# Patient Record
Sex: Female | Born: 1980 | Race: White | Hispanic: No | Marital: Married | Smoking: Never smoker
Health system: Southern US, Community
[De-identification: ages and names within clinical notes are randomized; demographics above are authoritative.]

## PROBLEM LIST (undated history)

## (undated) DIAGNOSIS — Z9889 Other specified postprocedural states: Secondary | ICD-10-CM

## (undated) DIAGNOSIS — M25579 Pain in unspecified ankle and joints of unspecified foot: Secondary | ICD-10-CM

## (undated) DIAGNOSIS — K219 Gastro-esophageal reflux disease without esophagitis: Secondary | ICD-10-CM

## (undated) DIAGNOSIS — T8859XA Other complications of anesthesia, initial encounter: Secondary | ICD-10-CM

## (undated) DIAGNOSIS — R51 Headache: Secondary | ICD-10-CM

## (undated) DIAGNOSIS — T4145XA Adverse effect of unspecified anesthetic, initial encounter: Secondary | ICD-10-CM

## (undated) DIAGNOSIS — R519 Headache, unspecified: Secondary | ICD-10-CM

## (undated) DIAGNOSIS — R112 Nausea with vomiting, unspecified: Secondary | ICD-10-CM

## (undated) HISTORY — PX: CARPAL TUNNEL RELEASE: SHX101

## (undated) HISTORY — PX: SPINAL CORD STIMULATOR IMPLANT: SHX2422

## (undated) HISTORY — PX: FOOT SURGERY: SHX648

## (undated) HISTORY — PX: ABDOMINAL HYSTERECTOMY: SHX81

## (undated) HISTORY — PX: WISDOM TOOTH EXTRACTION: SHX21

## (undated) HISTORY — PX: ENDOMETRIAL BIOPSY: SHX622

---

## 2005-01-24 ENCOUNTER — Emergency Department: Payer: Self-pay | Admitting: Emergency Medicine

## 2005-01-24 IMAGING — US US RENAL KIDNEY
1 series · 17 of 17 positions shown · non-contrast
Comparison: none

REASON FOR EXAM: Urinary retention
COMMENTS:

[Series 1: us renal kidney · 17 of 17 slices shown]
[im 1/17]
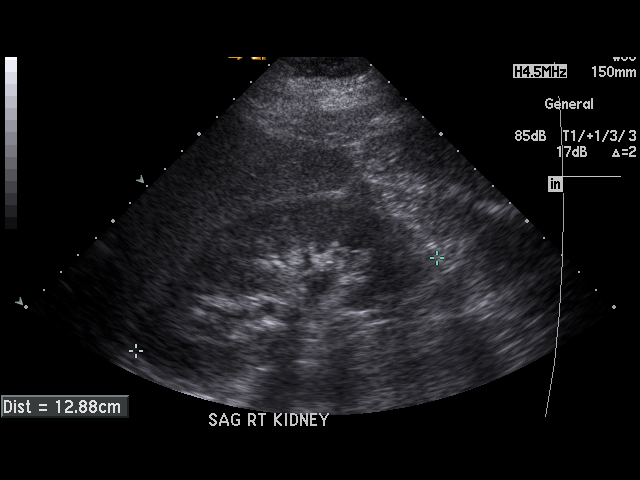
[im 2/17]
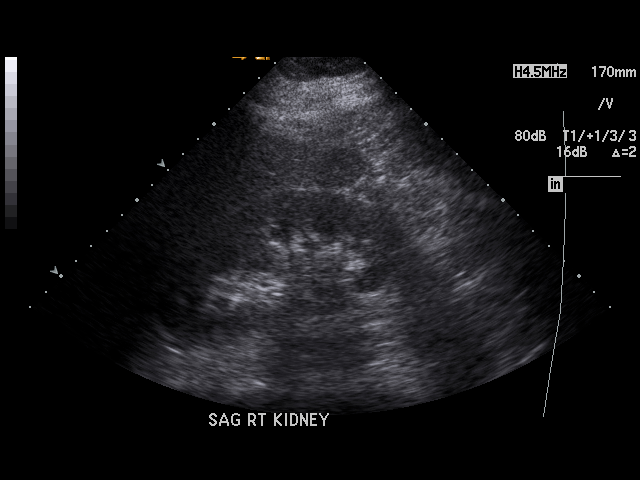
[im 3/17]
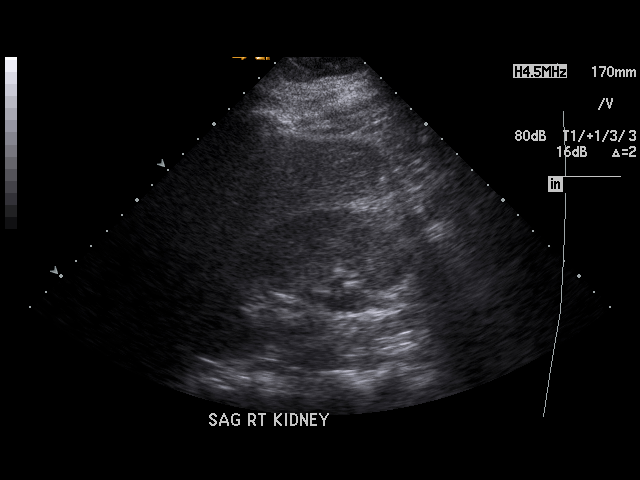
[im 4/17]
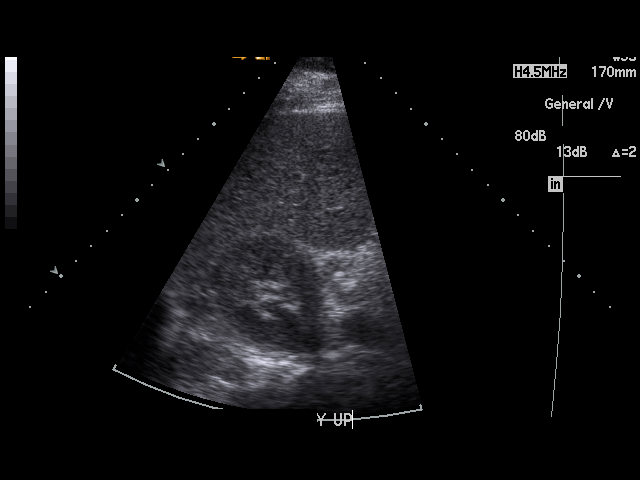
[im 5/17]
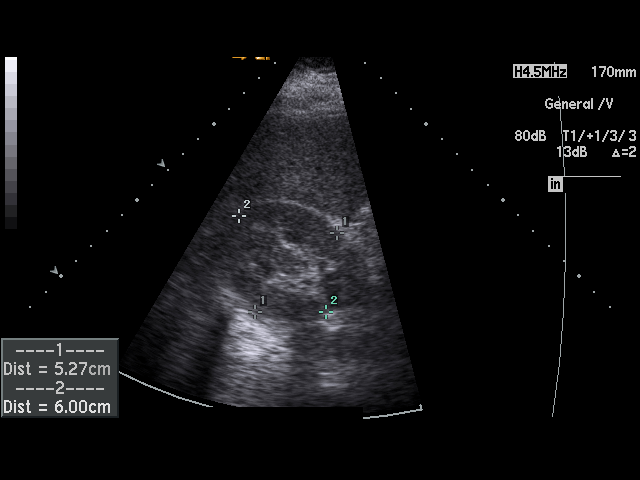
[im 6/17]
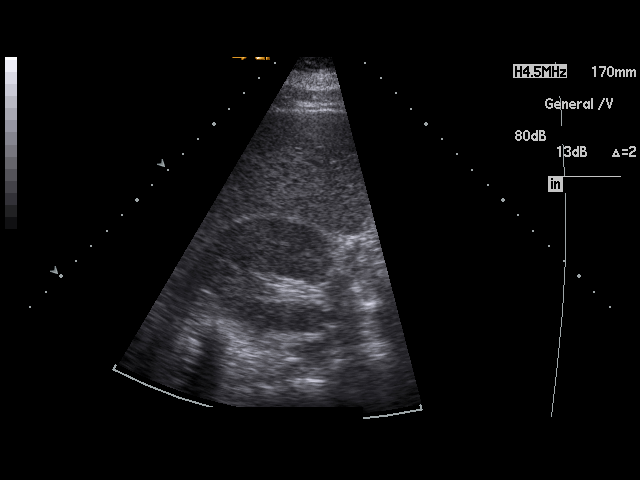
[im 7/17]
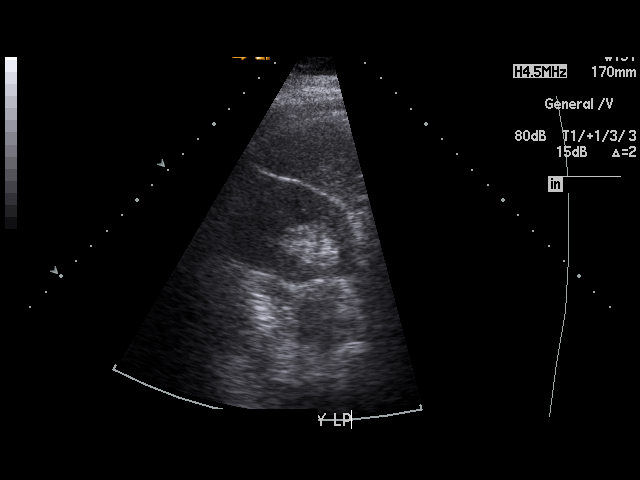
[im 8/17]
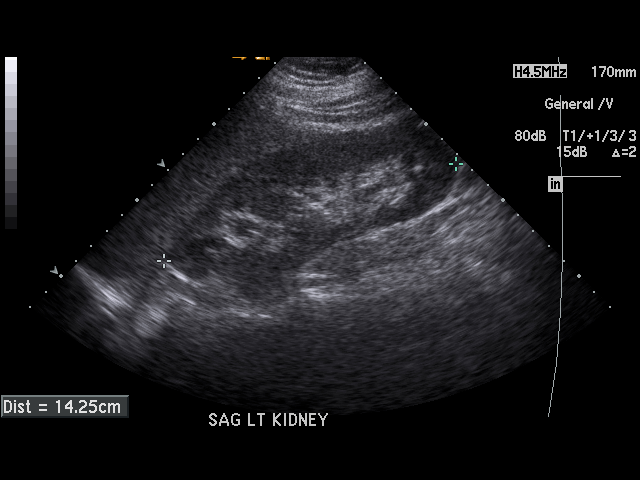
[im 9/17]
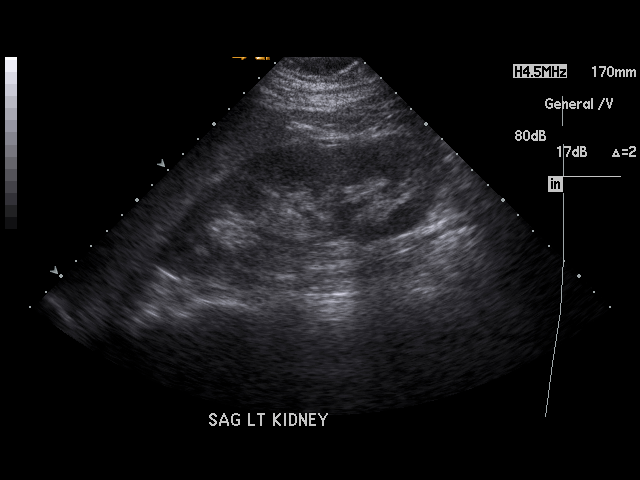
[im 10/17]
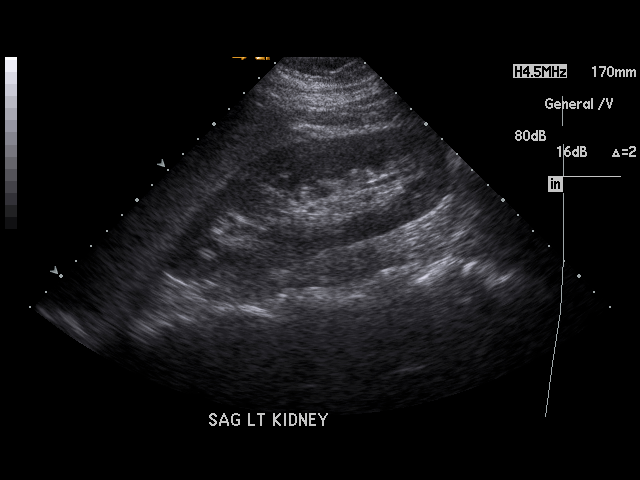
[im 11/17]
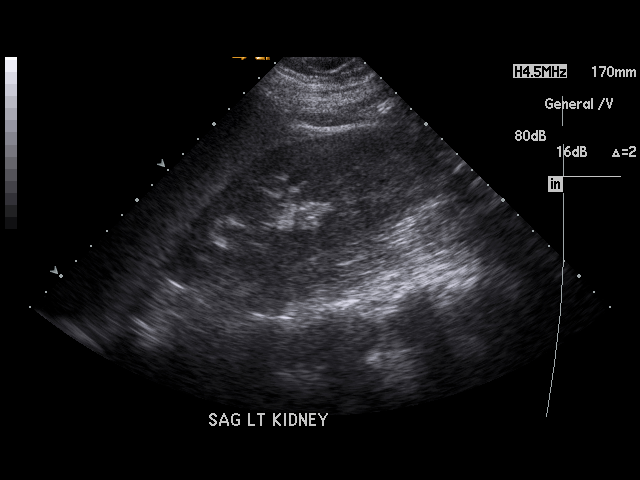
[im 12/17]
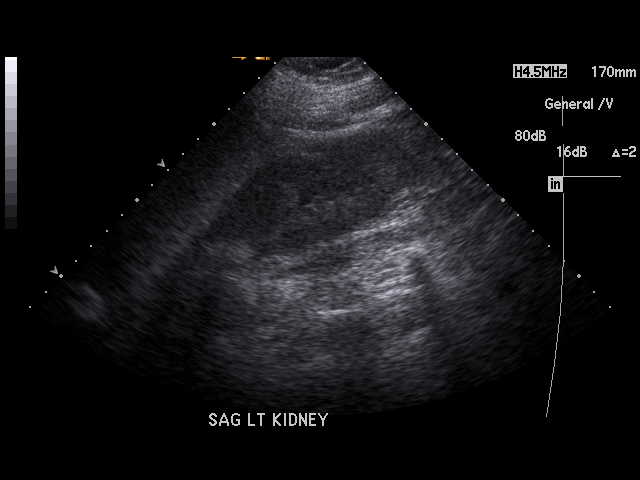
[im 13/17]
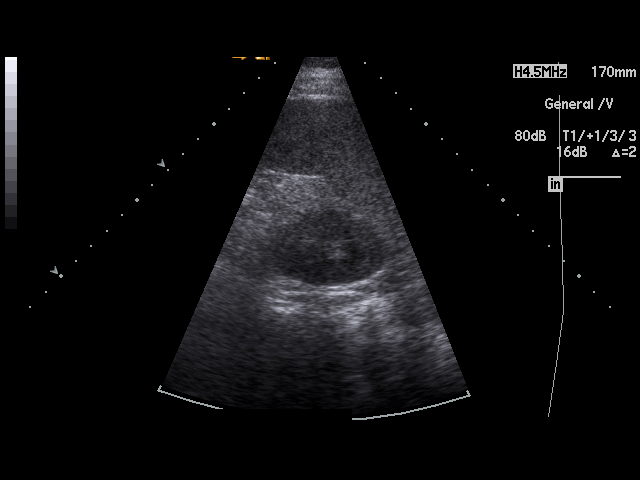
[im 14/17]
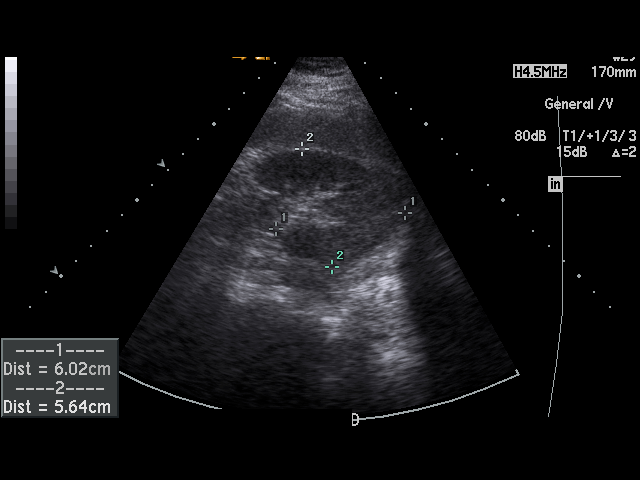
[im 15/17]
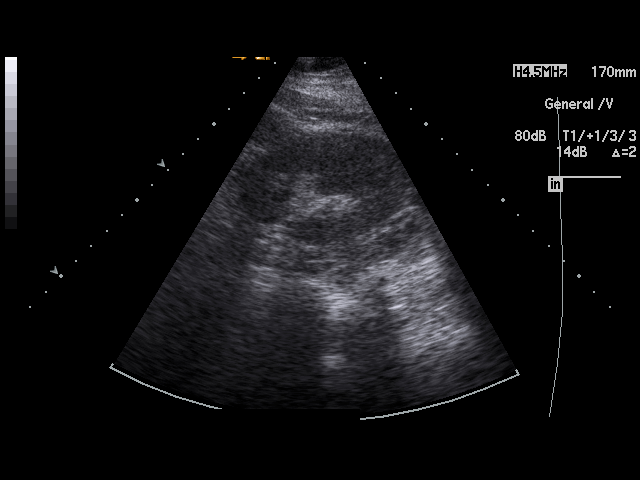
[im 16/17]
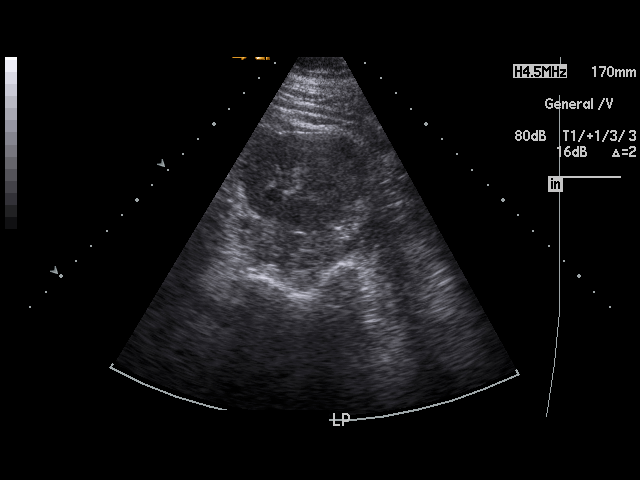
[im 17/17]
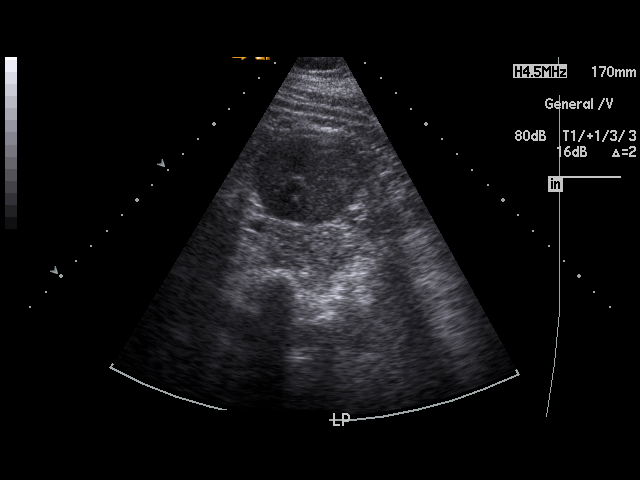

[17 of 17 positions shown; findings below may reference images not displayed]

PROCEDURE:     US  - US KIDNEY BILATERAL  - [DATE] [DATE]

RESULT:     The RIGHT kidney measures 12.88 cm x 5.27 cm x 6.00 cm and the
LEFT kidney measures 14.25 cm x 6.02 cm x 5.64 cm.  No solid or cystic renal
mass lesions are seen.  No renal calcifications are identified.  There is no
hydronephrosis.  The renal cortical margins are smooth.
IMPRESSION: No significant abnormalities are noted.

## 2005-08-02 ENCOUNTER — Observation Stay: Payer: Self-pay

## 2005-08-05 ENCOUNTER — Inpatient Hospital Stay: Payer: Self-pay | Admitting: Obstetrics & Gynecology

## 2006-04-03 ENCOUNTER — Ambulatory Visit: Payer: Self-pay | Admitting: Unknown Physician Specialty

## 2006-04-10 ENCOUNTER — Ambulatory Visit: Payer: Self-pay | Admitting: Unknown Physician Specialty

## 2008-06-28 ENCOUNTER — Ambulatory Visit: Payer: Self-pay | Admitting: Obstetrics and Gynecology

## 2012-07-17 ENCOUNTER — Ambulatory Visit: Payer: Self-pay | Admitting: Family Medicine

## 2012-07-17 LAB — COMPREHENSIVE METABOLIC PANEL
Anion Gap: 11 (ref 7–16)
BUN: 12 mg/dL (ref 7–18)
Bilirubin,Total: 0.9 mg/dL (ref 0.2–1.0)
Calcium, Total: 9.2 mg/dL (ref 8.5–10.1)
Chloride: 100 mmol/L (ref 98–107)
Co2: 28 mmol/L (ref 21–32)
Creatinine: 0.95 mg/dL (ref 0.60–1.30)
EGFR (African American): >60
Potassium: 3.9 mmol/L (ref 3.5–5.1)
SGOT(AST): 14 U/L — ABNORMAL LOW (ref 15–37)
Sodium: 139 mmol/L (ref 136–145)

## 2012-07-17 LAB — URINALYSIS, COMPLETE
Bilirubin,UR: NEGATIVE
Glucose,UR: NEGATIVE mg/dL (ref 0–75)
Ketone: NEGATIVE
Nitrite: NEGATIVE
Ph: 6.5 (ref 4.5–8.0)
Specific Gravity: 1.02 (ref 1.003–1.030)

## 2012-07-17 LAB — CBC WITH DIFFERENTIAL/PLATELET
Basophil #: 0 10*3/uL (ref 0.0–0.1)
Eosinophil #: 0 10*3/uL (ref 0.0–0.7)
Eosinophil %: 0.2 %
HGB: 14.5 g/dL (ref 12.0–16.0)
MCV: 86 fL (ref 80–100)
Monocyte %: 7.4 %
Neutrophil #: 6.1 10*3/uL (ref 1.4–6.5)
Neutrophil %: 82.6 %
Platelet: 206 10*3/uL (ref 150–440)
RBC: 4.9 10*6/uL (ref 3.80–5.20)
WBC: 7.4 10*3/uL (ref 3.6–11.0)

## 2012-07-19 LAB — URINE CULTURE

## 2013-05-05 DIAGNOSIS — G5600 Carpal tunnel syndrome, unspecified upper limb: Secondary | ICD-10-CM | POA: Insufficient documentation

## 2014-01-17 DIAGNOSIS — G43009 Migraine without aura, not intractable, without status migrainosus: Secondary | ICD-10-CM | POA: Insufficient documentation

## 2015-03-03 ENCOUNTER — Other Ambulatory Visit: Payer: Self-pay | Admitting: Podiatry

## 2015-03-03 DIAGNOSIS — M25571 Pain in right ankle and joints of right foot: Secondary | ICD-10-CM

## 2015-03-23 ENCOUNTER — Ambulatory Visit: Payer: Self-pay

## 2015-06-07 ENCOUNTER — Ambulatory Visit
Admission: RE | Admit: 2015-06-07 | Discharge: 2015-06-07 | Disposition: A | Discharge status: Home / Self Care | Payer: BLUE CROSS/BLUE SHIELD | Source: Ambulatory Visit | Attending: Otolaryngology | Admitting: Otolaryngology

## 2015-06-07 ENCOUNTER — Other Ambulatory Visit: Payer: Self-pay | Admitting: Otolaryngology

## 2015-06-07 DIAGNOSIS — G44001 Cluster headache syndrome, unspecified, intractable: Secondary | ICD-10-CM

## 2015-06-07 DIAGNOSIS — R51 Headache: Secondary | ICD-10-CM | POA: Insufficient documentation

## 2015-06-07 IMAGING — CR DG SINUSES COMPLETE 3+V
4 series · 5 of 5 positions shown · non-contrast
Comparison: None.

CLINICAL DATA: Cluster headache for several months, right side pain

EXAM:
PARANASAL SINUSES - COMPLETE 3 + VIEW

[[person_name]]
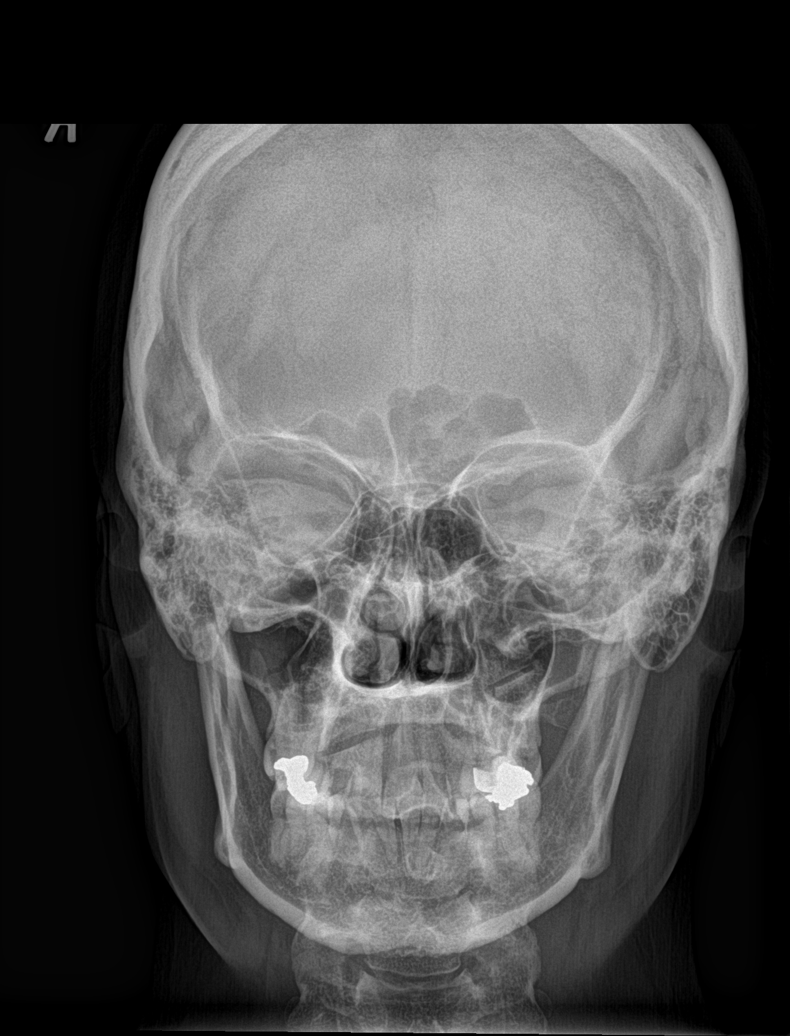

[pns waters]
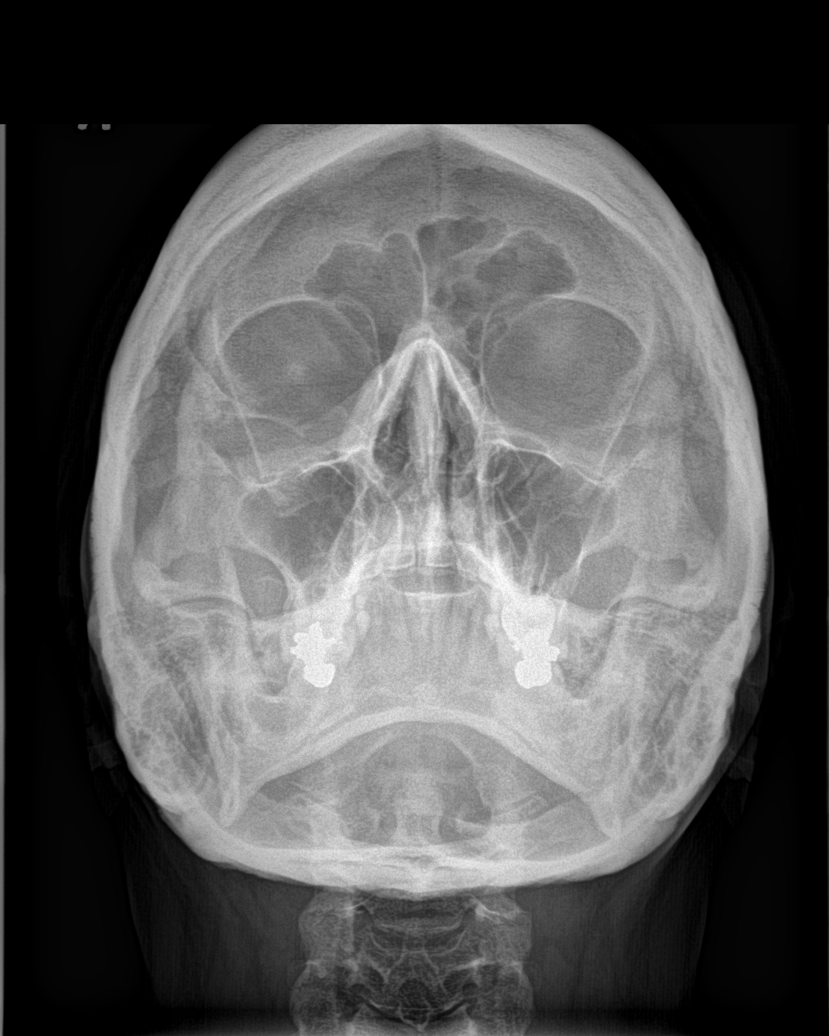

[pns lat]
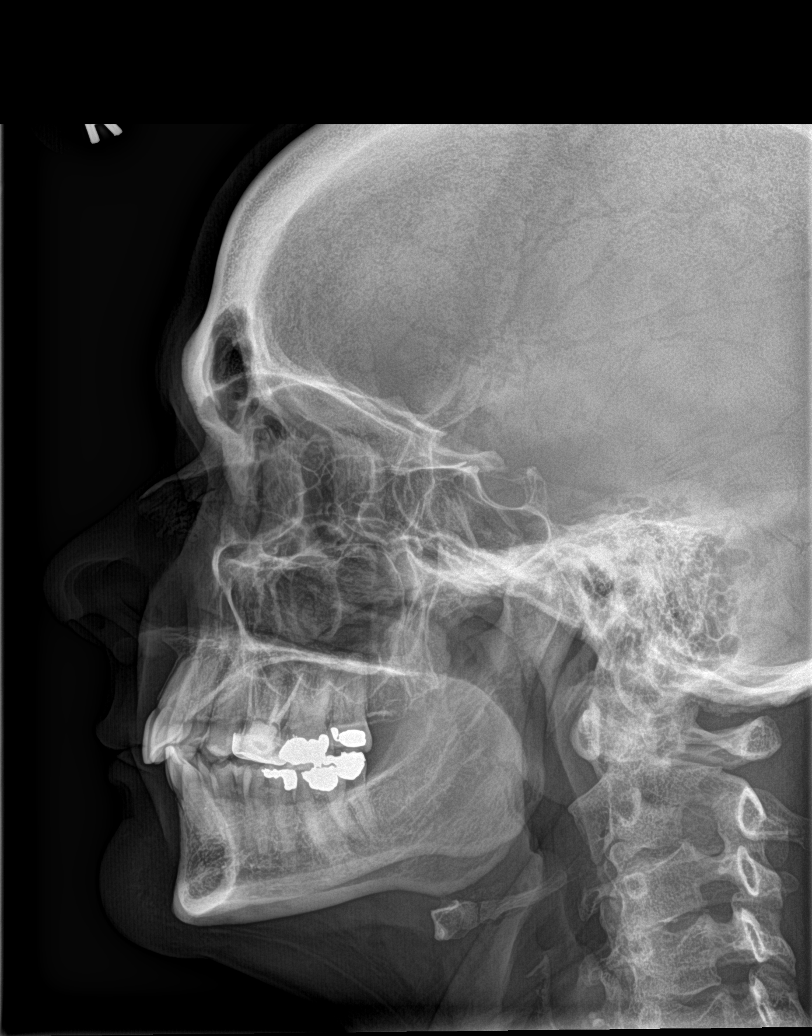

[Series 4: facial smv · 0.14mm/px · 2 of 2 slices shown]
[im 1/2]
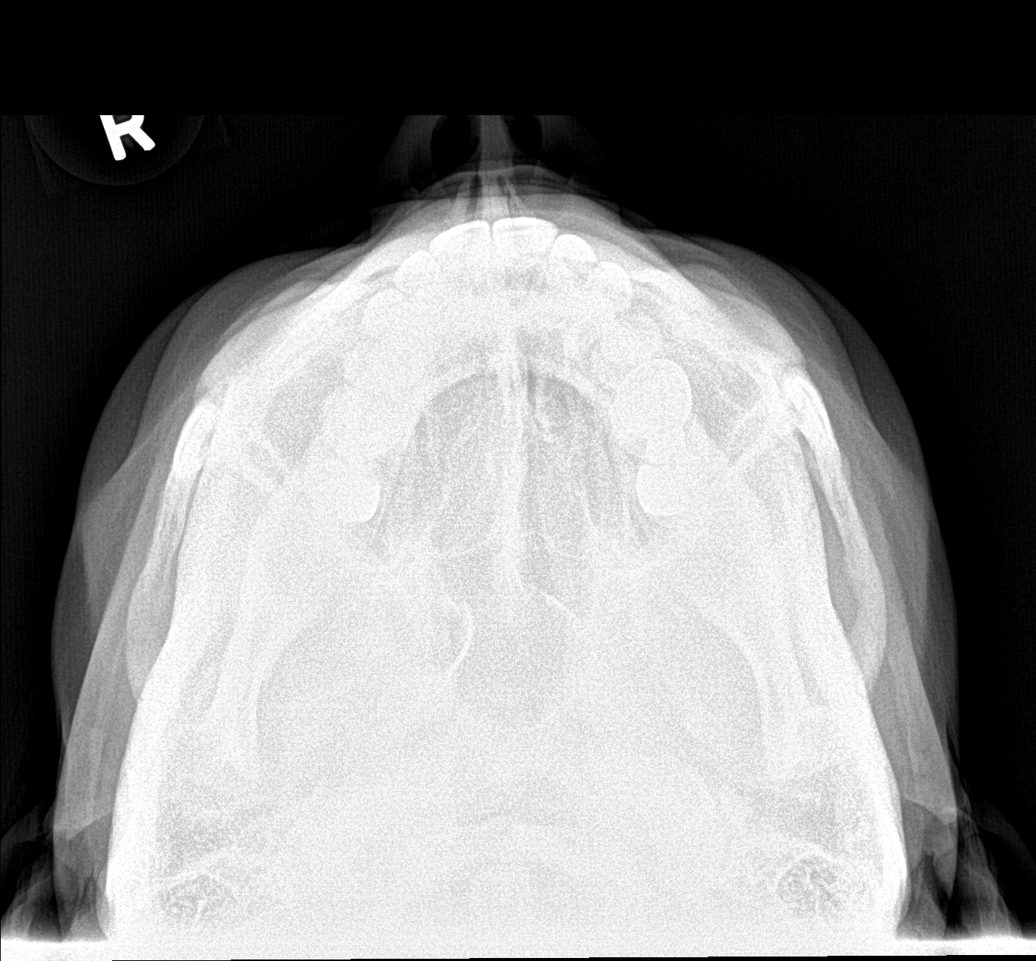
[im 2/2]
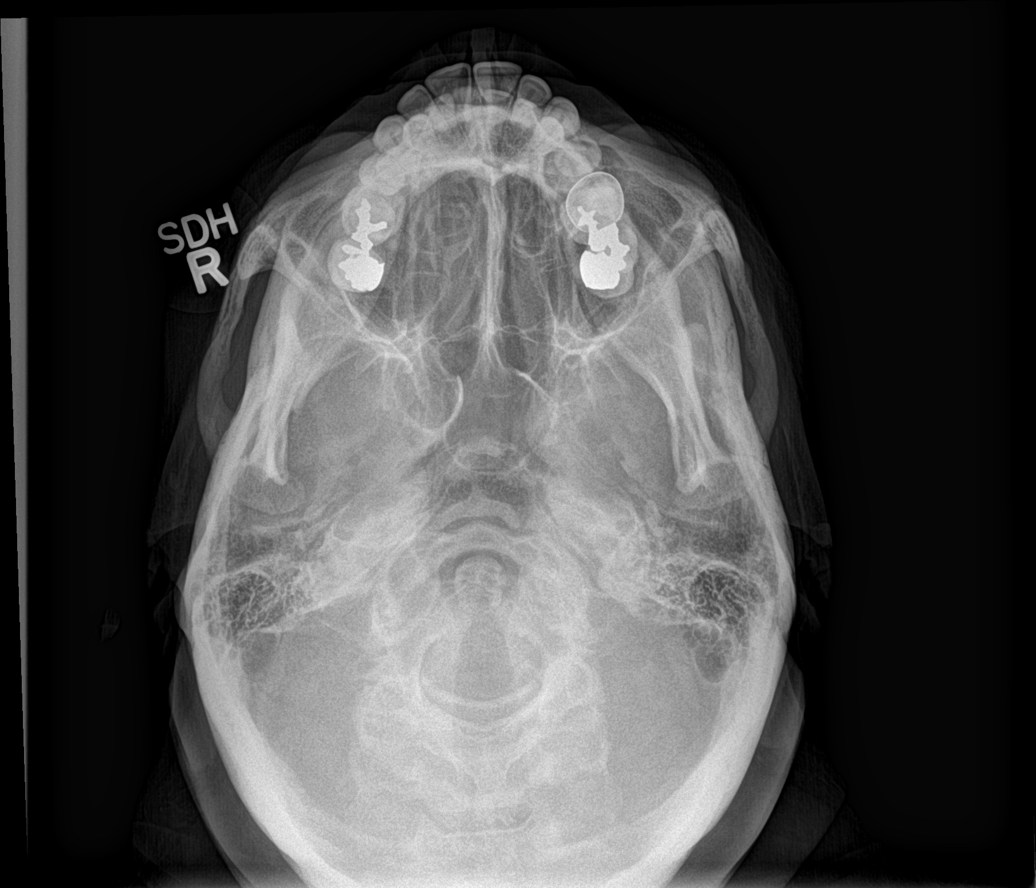

[5 of 5 positions shown; findings below may reference images not displayed]

FINDINGS: The paranasal sinus are aerated. There is no evidence of sinus
opacification air-fluid levels or mucosal thickening. No significant
bone abnormalities are seen.
IMPRESSION: Negative.

## 2015-09-30 IMAGING — US US ABDOMEN COMPLETE
1 series · 13 of 25 positions shown · non-contrast
Comparison: None.

CLINICAL DATA: Abdominal pain

EXAM:
ABDOMEN ULTRASOUND COMPLETE

[Series 1: us abdomen complete · 0.18mm/px · 13 of 90 slices shown]
[im 1/90]
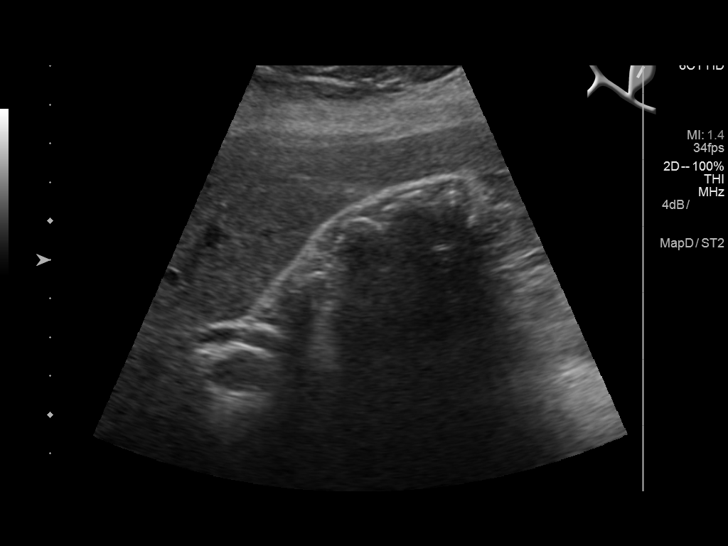
[im 8/90]
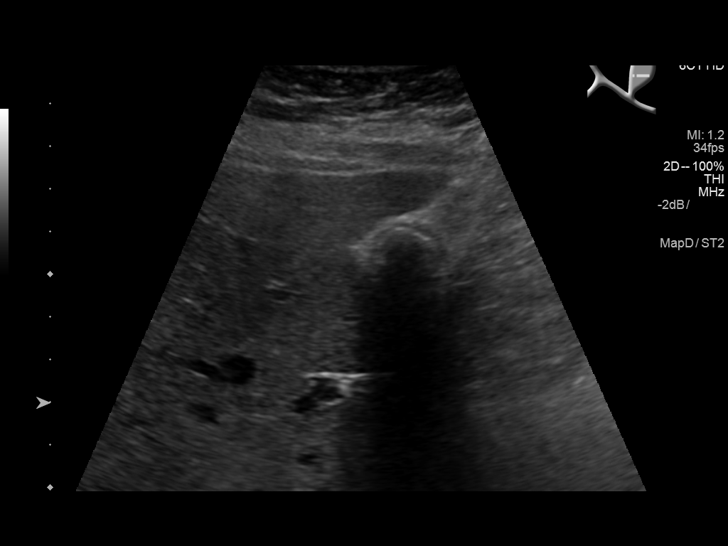
[im 15/90]
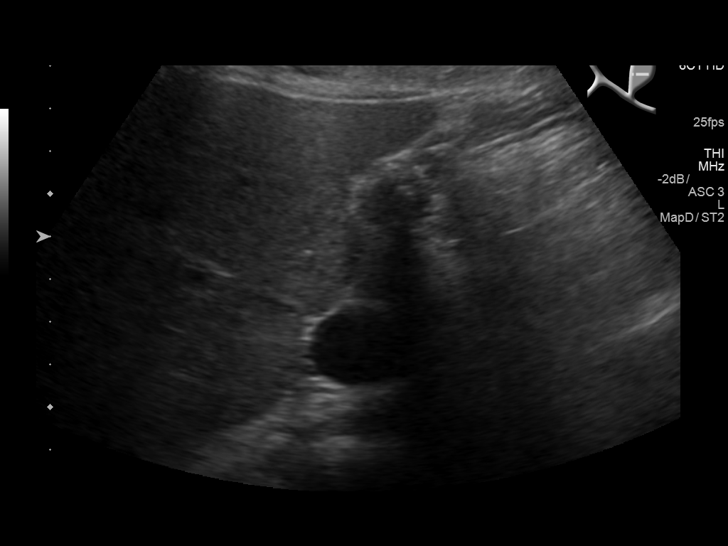
[im 23/90]
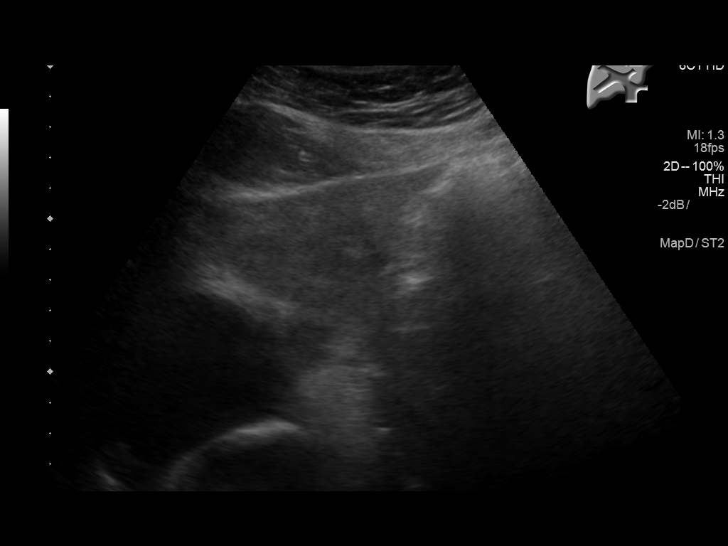
[im 30/90]
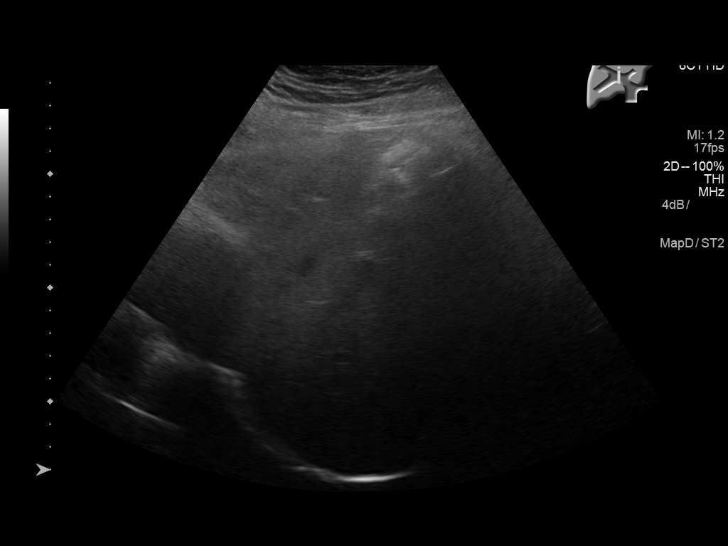
[im 38/90]
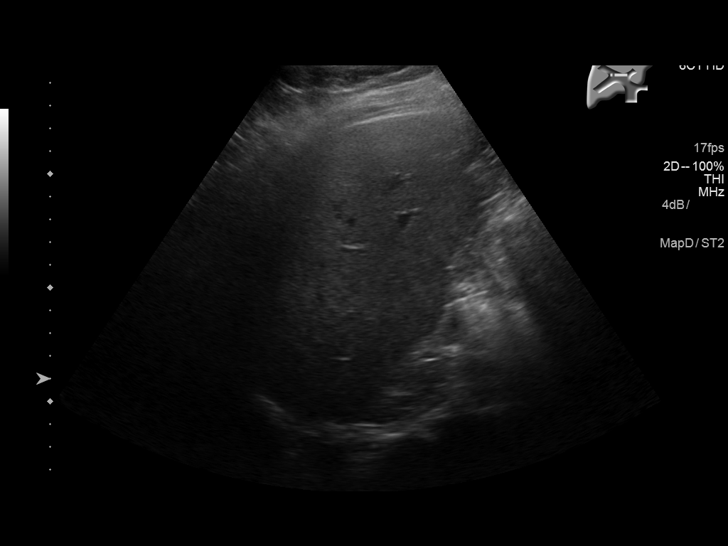
[im 45/90]
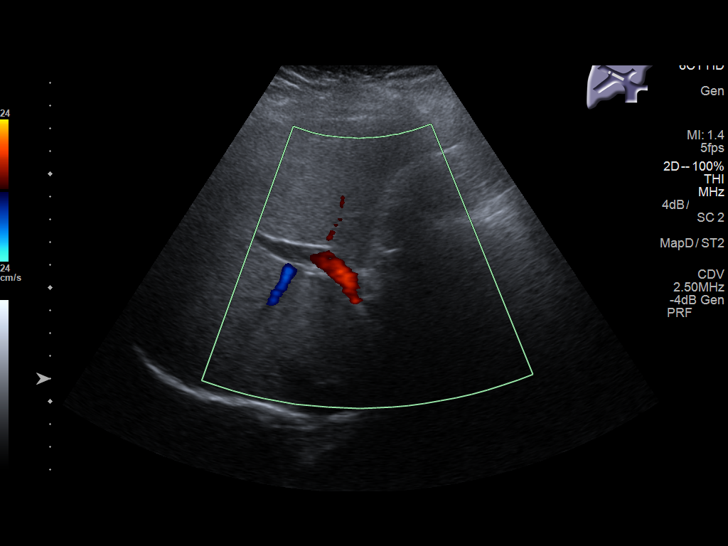
[im 52/90]
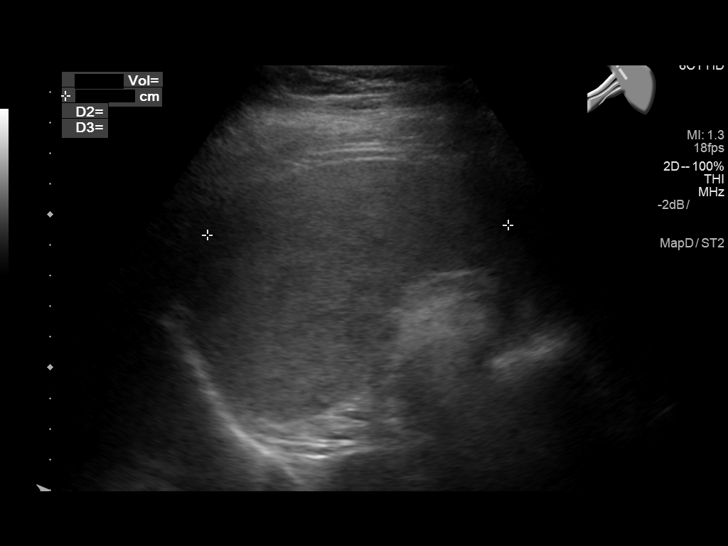
[im 60/90]
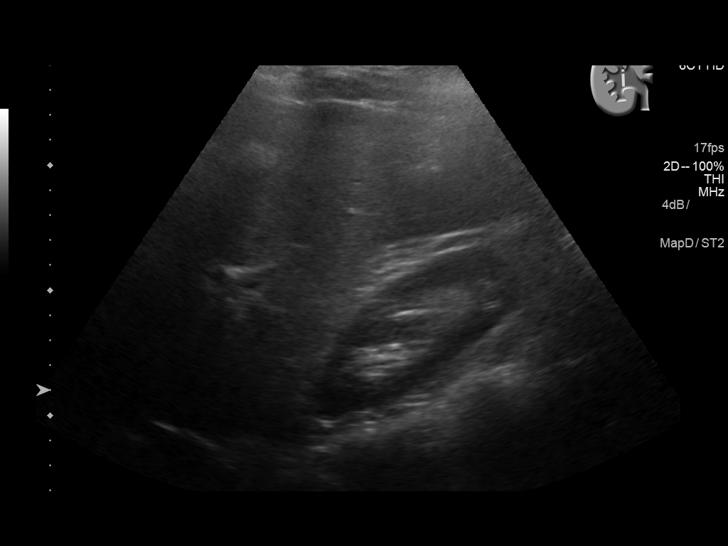
[im 67/90]
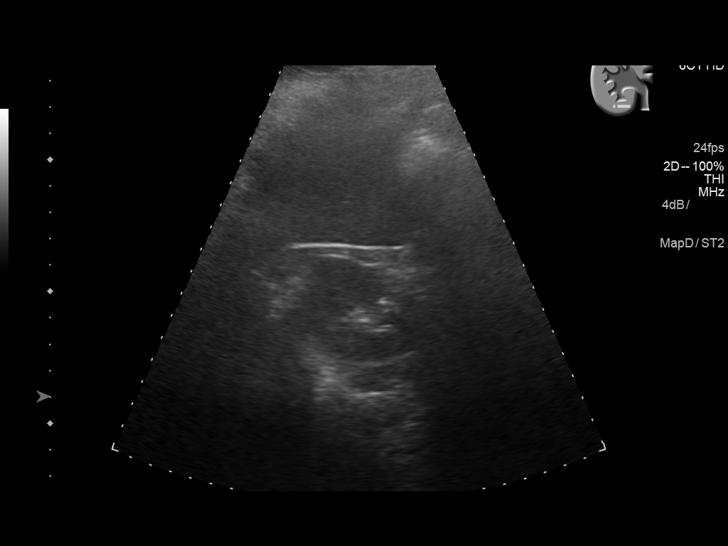
[im 75/90]
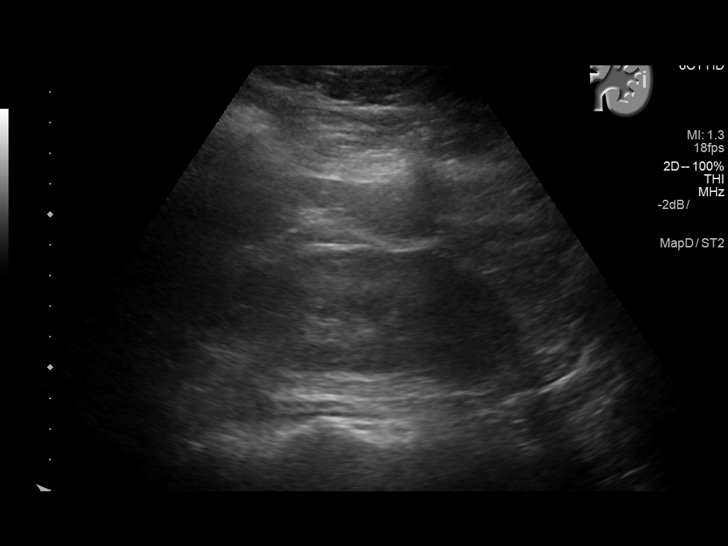
[im 82/90]
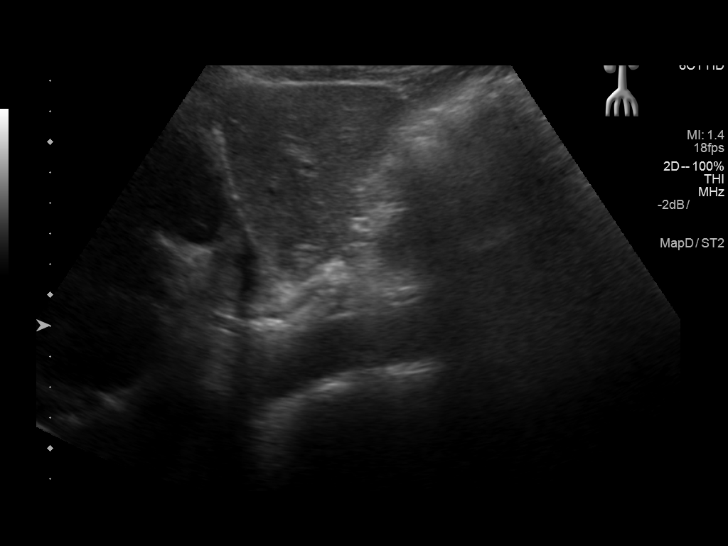
[im 90/90]
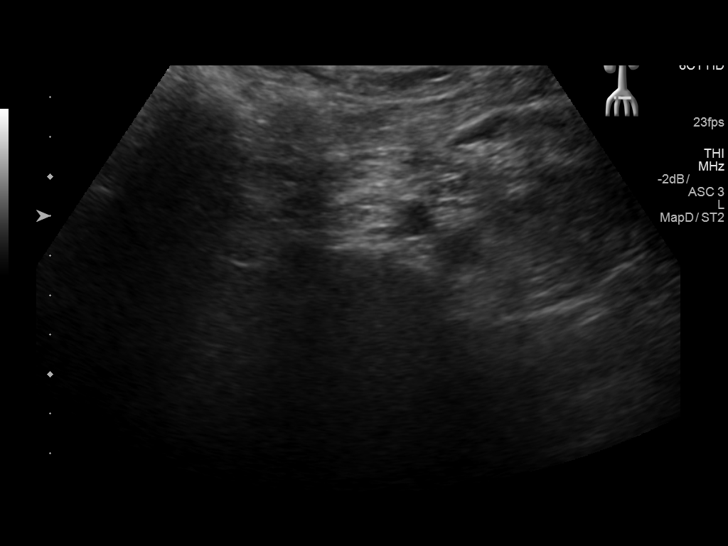

[13 of 25 positions shown; findings below may reference images not displayed]

FINDINGS: Gallbladder: There are multiple echogenic foci in the gallbladder
which move and shadow consistent with cholelithiasis. Largest
individual gallstone measures 1.5 cm in length. There is no
gallbladder wall thickening or pericholecystic fluid. No sonographic
Murphy sign noted by sonographer.

Common bile duct: Diameter: 2 mm. There is no intrahepatic, common
hepatic, or common bile duct dilatation.

Liver: No focal lesion identified. Within normal limits in
parenchymal echogenicity.

IVC: No abnormality visualized.

Pancreas: Visualized portion unremarkable. Portions of pancreas are
obscured by gas.

Spleen: Size and appearance within normal limits.

Right Kidney: Length: 10.5 cm. Echogenicity within normal limits. No
mass or hydronephrosis visualized.

Left Kidney: Length: 11.9 cm. Echogenicity within normal limits. No
mass or hydronephrosis visualized.

Abdominal aorta: No aneurysm visualized.

Other findings: No demonstrable ascites.
IMPRESSION: Cholelithiasis without gallbladder wall thickening or
pericholecystic fluid. Gallbladder is filled with calculi currently.

Portions of pancreas obscured by gas. Visualized portions of
pancreas appear unremarkable.

Study otherwise unremarkable.

## 2016-05-16 ENCOUNTER — Other Ambulatory Visit: Payer: Self-pay | Admitting: Student

## 2016-05-16 DIAGNOSIS — R1013 Epigastric pain: Secondary | ICD-10-CM

## 2016-05-16 DIAGNOSIS — R1011 Right upper quadrant pain: Secondary | ICD-10-CM

## 2016-05-17 ENCOUNTER — Ambulatory Visit
Admission: RE | Admit: 2016-05-17 | Discharge: 2016-05-17 | Disposition: A | Discharge status: Home / Self Care | Payer: BLUE CROSS/BLUE SHIELD | Source: Ambulatory Visit | Attending: Student | Admitting: Student

## 2016-05-17 DIAGNOSIS — R1011 Right upper quadrant pain: Secondary | ICD-10-CM | POA: Insufficient documentation

## 2016-05-17 DIAGNOSIS — K8020 Calculus of gallbladder without cholecystitis without obstruction: Secondary | ICD-10-CM | POA: Insufficient documentation

## 2016-05-17 DIAGNOSIS — R1013 Epigastric pain: Secondary | ICD-10-CM | POA: Insufficient documentation

## 2016-05-21 ENCOUNTER — Other Ambulatory Visit: Payer: Self-pay | Admitting: Family Medicine

## 2016-05-21 DIAGNOSIS — K8080 Other cholelithiasis without obstruction: Secondary | ICD-10-CM

## 2016-05-21 DIAGNOSIS — M25579 Pain in unspecified ankle and joints of unspecified foot: Secondary | ICD-10-CM

## 2016-05-21 HISTORY — DX: Pain in unspecified ankle and joints of unspecified foot: M25.579

## 2016-05-27 ENCOUNTER — Encounter (HOSPITAL_COMMUNITY): Payer: Self-pay | Admitting: Radiology

## 2016-05-27 ENCOUNTER — Encounter (HOSPITAL_COMMUNITY)
Admission: RE | Admit: 2016-05-27 | Discharge: 2016-05-27 | Disposition: A | Discharge status: Home / Self Care | Payer: BLUE CROSS/BLUE SHIELD | Source: Ambulatory Visit | Attending: Family Medicine | Admitting: Family Medicine

## 2016-05-27 DIAGNOSIS — K8080 Other cholelithiasis without obstruction: Secondary | ICD-10-CM | POA: Insufficient documentation

## 2016-05-27 IMAGING — NM NM HEPATO W/GB/PHARM/[PERSON_NAME]
1 series · 18 of 18 positions shown · non-contrast
Comparison: [DATE] abdominal sonogram.

CLINICAL DATA: History of right upper quadrant abdominal pain
radiating to the back with nausea. History of cholelithiasis on
recent abdominal sonogram. Patient reports no acute abdominal pain.

EXAM:
NUCLEAR MEDICINE HEPATOBILIARY IMAGING WITH GALLBLADDER EF
TECHNIQUE: Sequential images of the abdomen were obtained [DATE] minutes
following intravenous administration of radiopharmaceutical. IV
Morphine 3 mg was subsequently administered due to nonvisualization
of the gallbladder, along with a small bolus of 0.9 mCi [3F]
Choletec IV, and an additional 30 minutes of imaging was obtained.
RADIOPHARMACEUTICALS:  5.2 mCi [3F]  Choletec IV

[Series 1: hepato · 4.46mm/px · 3 acquisitions, 18 frames shown]
[im 1/3]
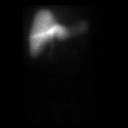
[im 1/3]
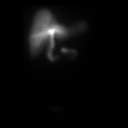
[im 1/3]
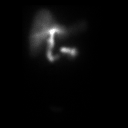
[im 1/3]
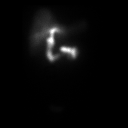
[im 1/3]
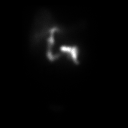
[im 1/3]
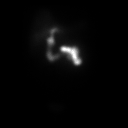
[im 2/3]
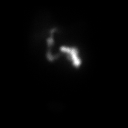
[im 2/3]
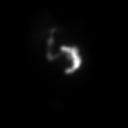
[im 2/3]
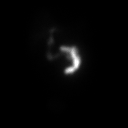
[im 2/3]
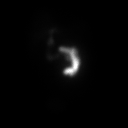
[im 2/3]
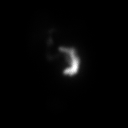
[im 2/3]
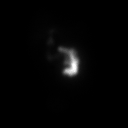
[im 3/3]
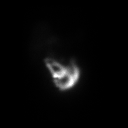
[im 3/3]
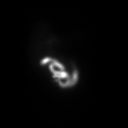
[im 3/3]
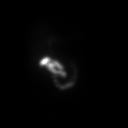
[im 3/3]
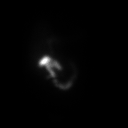
[im 3/3]
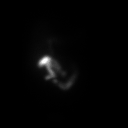
[im 3/3]
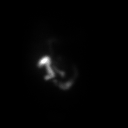

[18 of 18 positions shown; findings below may reference images not displayed]

FINDINGS: Prompt uptake and biliary excretion of activity by the liver is
seen. Gallbladder activity is not definitely visualized even after
IV morphine administration. Gallbladder ejection fraction cannot be
calculated due to absence gallbladder activity. Biliary activity
passes into small bowel, consistent with patent common bile duct.
IMPRESSION: 1. Nonvisualization of the gallbladder, even after IV morphine
administration. Cystic duct patency cannot be established. The
patient reports no acute abdominal pain to suggest acute
cholecystitis. Of note, the gallbladder appeared completely filled
with gallstones on the recent sonogram.
2. Patent common bile duct.

## 2016-05-27 MED ORDER — MORPHINE SULFATE (PF) 4 MG/ML IV SOLN
3.0000 mg | Freq: Once | INTRAVENOUS | Status: AC
Start: 2016-05-27 — End: 2016-05-27
  Administered 2016-05-27: 3 mg via INTRAVENOUS

## 2016-05-27 MED ORDER — TECHNETIUM TC 99M MEBROFENIN IV KIT
5.2000 | PACK | Freq: Once | INTRAVENOUS | Status: AC | PRN
  Administered 2016-05-27: 5.2 via INTRAVENOUS

## 2016-05-27 MED ORDER — MORPHINE SULFATE (PF) 4 MG/ML IV SOLN
INTRAVENOUS | Status: AC
  Administered 2016-05-27: 3 mg via INTRAVENOUS
  Filled 2016-05-27: qty 1

## 2016-05-30 ENCOUNTER — Ambulatory Visit: Payer: BLUE CROSS/BLUE SHIELD

## 2016-06-04 ENCOUNTER — Encounter
Admission: RE | Admit: 2016-06-04 | Discharge: 2016-06-04 | Disposition: A | Discharge status: Home / Self Care | Payer: BLUE CROSS/BLUE SHIELD | Source: Ambulatory Visit | Attending: Surgery | Admitting: Surgery

## 2016-06-04 HISTORY — DX: Other complications of anesthesia, initial encounter: T88.59XA

## 2016-06-04 HISTORY — DX: Gastro-esophageal reflux disease without esophagitis: K21.9

## 2016-06-04 HISTORY — DX: Other specified postprocedural states: Z98.890

## 2016-06-04 HISTORY — DX: Pain in unspecified ankle and joints of unspecified foot: M25.579

## 2016-06-04 HISTORY — DX: Headache, unspecified: R51.9

## 2016-06-04 HISTORY — DX: Nausea with vomiting, unspecified: R11.2

## 2016-06-04 HISTORY — DX: Headache: R51

## 2016-06-04 HISTORY — DX: Adverse effect of unspecified anesthetic, initial encounter: T41.45XA

## 2016-06-04 NOTE — Patient Instructions (Signed)
  Your procedure is scheduled on: 06-11-16 Report to Same Day Surgery 2nd floor medical mall [facility](Medical Mall Entrance-take elevator on left to 2nd floor.  Check in with surgery information desk.) To find out your arrival time please call [phone] between 1PM - 3PM on 06-10-16  Remember: Instructions that are not followed completely may result in serious medical risk, up to and including death, or upon the discretion of your surgeon and anesthesiologist your surgery may need to be rescheduled.    _x___ 1. Do not eat food or drink liquids after midnight. No gum chewing or hard candies.     __x__ 2. No Alcohol for 24 hours before or after surgery.   __x__3. No Smoking for 24 prior to surgery.   ____  4. Bring all medications with you on the day of surgery if instructed.    __x__ 5. Notify your doctor if there is any change in your medical condition     (cold, fever, infections).     Do not wear jewelry, make-up, hairpins, clips or nail polish.  Do not wear lotions, powders, or perfumes. You may wear deodorant.  Do not shave 48 hours prior to surgery. Men may shave face and neck.  Do not bring valuables to the hospital.    [facility] is not responsible for any belongings or valuables.               Contacts, dentures or bridgework may not be worn into surgery.  Leave your suitcase in the car. After surgery it may be brought to your room.  For patients admitted to the hospital, discharge time is determined by your treatment team.   Patients discharged the day of surgery will not be allowed to drive home.  You will need someone to drive you home and stay with you the night of your procedure.    Please read over the following fact sheets that you were given:   [facility] Preparing for [facility] and or MRSA Information   ____ Take anti-hypertensive (unless it includes a diuretic), cardiac, seizure, asthma,     anti-reflux and psychiatric medicines. These include:  1.  NONE  2.  3.  4.  5.  6.  ____Fleets enema or Magnesium Citrate as directed.   ____ Use CHG Soap or sage wipes as directed on instruction sheet   ____ Use inhalers on the day of surgery and bring to hospital day of surgery  ____ Stop Metformin and Janumet 2 days prior to surgery.    ____ Take 1/2 of usual insulin dose the night before surgery and none on the morning surgery.   ____ Follow recommendations from Cardiologist, Pulmonologist or PCP regarding stopping Aspirin, Coumadin, Pllavix ,Eliquis, Effient, or Pradaxa, and Pletal.  X____Stop Anti-inflammatories such as Advil, Aleve, IBUPROFEN, Motrin, Naproxen, Naprosyn, Goodies powders or aspirin products NOW-OK to take Tylenol    ____ Stop supplements until after surgery.    ____ Bring C-Pap to the hospital.

## 2016-06-11 ENCOUNTER — Ambulatory Visit: Payer: BLUE CROSS/BLUE SHIELD

## 2016-06-11 ENCOUNTER — Ambulatory Visit: Payer: BLUE CROSS/BLUE SHIELD | Admitting: Anesthesiology

## 2016-06-11 ENCOUNTER — Encounter
Admission: RE | Disposition: A | Discharge status: Home / Self Care | Payer: Self-pay | Source: Ambulatory Visit | Attending: Surgery

## 2016-06-11 ENCOUNTER — Ambulatory Visit
Admission: RE | Admit: 2016-06-11 | Discharge: 2016-06-11 | Disposition: A | Discharge status: Home / Self Care | Payer: BLUE CROSS/BLUE SHIELD | Source: Ambulatory Visit | Attending: Surgery | Admitting: Surgery

## 2016-06-11 ENCOUNTER — Encounter: Payer: Self-pay | Admitting: *Deleted

## 2016-06-11 DIAGNOSIS — K219 Gastro-esophageal reflux disease without esophagitis: Secondary | ICD-10-CM | POA: Diagnosis not present

## 2016-06-11 DIAGNOSIS — Z818 Family history of other mental and behavioral disorders: Secondary | ICD-10-CM | POA: Diagnosis not present

## 2016-06-11 DIAGNOSIS — Z90710 Acquired absence of both cervix and uterus: Secondary | ICD-10-CM | POA: Insufficient documentation

## 2016-06-11 DIAGNOSIS — K8010 Calculus of gallbladder with chronic cholecystitis without obstruction: Secondary | ICD-10-CM | POA: Diagnosis not present

## 2016-06-11 DIAGNOSIS — Z9079 Acquired absence of other genital organ(s): Secondary | ICD-10-CM | POA: Insufficient documentation

## 2016-06-11 DIAGNOSIS — M25571 Pain in right ankle and joints of right foot: Secondary | ICD-10-CM | POA: Diagnosis not present

## 2016-06-11 DIAGNOSIS — Z8249 Family history of ischemic heart disease and other diseases of the circulatory system: Secondary | ICD-10-CM | POA: Diagnosis not present

## 2016-06-11 DIAGNOSIS — Z6841 Body Mass Index (BMI) 40.0 and over, adult: Secondary | ICD-10-CM | POA: Diagnosis not present

## 2016-06-11 DIAGNOSIS — Z8261 Family history of arthritis: Secondary | ICD-10-CM | POA: Diagnosis not present

## 2016-06-11 DIAGNOSIS — G5600 Carpal tunnel syndrome, unspecified upper limb: Secondary | ICD-10-CM | POA: Diagnosis not present

## 2016-06-11 DIAGNOSIS — E669 Obesity, unspecified: Secondary | ICD-10-CM | POA: Insufficient documentation

## 2016-06-11 DIAGNOSIS — Z419 Encounter for procedure for purposes other than remedying health state, unspecified: Secondary | ICD-10-CM

## 2016-06-11 HISTORY — PX: CHOLECYSTECTOMY: SHX55

## 2016-06-11 SURGERY — LAPAROSCOPIC CHOLECYSTECTOMY WITH INTRAOPERATIVE CHOLANGIOGRAM
Anesthesia: General | Wound class: Clean Contaminated

## 2016-06-11 MED ORDER — MIDAZOLAM HCL 2 MG/2ML IJ SOLN
INTRAMUSCULAR | Status: AC
  Filled 2016-06-11: qty 2

## 2016-06-11 MED ORDER — SODIUM CHLORIDE 0.9 % IJ SOLN
INTRAMUSCULAR | Status: AC
  Filled 2016-06-11: qty 20

## 2016-06-11 MED ORDER — DEXAMETHASONE SODIUM PHOSPHATE 10 MG/ML IJ SOLN
INTRAMUSCULAR | Status: DC | PRN
  Administered 2016-06-11: 4 mg via INTRAVENOUS

## 2016-06-11 MED ORDER — LACTATED RINGERS IV SOLN
INTRAVENOUS | Status: DC
  Administered 2016-06-11: 07:00:00 via INTRAVENOUS

## 2016-06-11 MED ORDER — ROCURONIUM BROMIDE 50 MG/5ML IV SOLN
INTRAVENOUS | Status: AC
  Filled 2016-06-11: qty 1

## 2016-06-11 MED ORDER — ONDANSETRON HCL 4 MG/2ML IJ SOLN
INTRAMUSCULAR | Status: AC
  Filled 2016-06-11: qty 2

## 2016-06-11 MED ORDER — LIDOCAINE HCL (CARDIAC) 20 MG/ML IV SOLN
INTRAVENOUS | Status: DC | PRN
  Administered 2016-06-11: 100 mg via INTRAVENOUS

## 2016-06-11 MED ORDER — SUCCINYLCHOLINE CHLORIDE 20 MG/ML IJ SOLN
INTRAMUSCULAR | Status: AC
  Filled 2016-06-11: qty 1

## 2016-06-11 MED ORDER — EPHEDRINE SULFATE 50 MG/ML IJ SOLN
INTRAMUSCULAR | Status: AC
  Filled 2016-06-11: qty 1

## 2016-06-11 MED ORDER — PROPOFOL 10 MG/ML IV BOLUS
INTRAVENOUS | Status: DC | PRN
  Administered 2016-06-11: 200 mg via INTRAVENOUS

## 2016-06-11 MED ORDER — SUGAMMADEX SODIUM 200 MG/2ML IV SOLN
INTRAVENOUS | Status: DC | PRN
  Administered 2016-06-11: 200 mg via INTRAVENOUS

## 2016-06-11 MED ORDER — SCOPOLAMINE 1 MG/3DAYS TD PT72
1.0000 | MEDICATED_PATCH | Freq: Once | TRANSDERMAL | Status: DC
  Administered 2016-06-11: 1.5 mg via TRANSDERMAL

## 2016-06-11 MED ORDER — HEPARIN SODIUM (PORCINE) 5000 UNIT/ML IJ SOLN
INTRAMUSCULAR | Status: AC
  Filled 2016-06-11: qty 1

## 2016-06-11 MED ORDER — ONDANSETRON HCL 4 MG/2ML IJ SOLN
INTRAMUSCULAR | Status: DC | PRN
  Administered 2016-06-11: 4 mg via INTRAVENOUS

## 2016-06-11 MED ORDER — HYDROCODONE-ACETAMINOPHEN 5-325 MG PO TABS: 1.0000 | ORAL_TABLET | ORAL | 0 refills | Status: DC | PRN

## 2016-06-11 MED ORDER — FENTANYL CITRATE (PF) 100 MCG/2ML IJ SOLN
25.0000 ug | INTRAMUSCULAR | Status: DC | PRN
  Administered 2016-06-11 (×2): 50 ug via INTRAVENOUS

## 2016-06-11 MED ORDER — PROMETHAZINE HCL 25 MG/ML IJ SOLN
INTRAMUSCULAR | Status: AC
  Administered 2016-06-11: 12.5 mg via INTRAVENOUS
  Filled 2016-06-11: qty 1

## 2016-06-11 MED ORDER — FAMOTIDINE 20 MG PO TABS
ORAL_TABLET | ORAL | Status: AC
  Administered 2016-06-11: 20 mg via ORAL
  Filled 2016-06-11: qty 1

## 2016-06-11 MED ORDER — PROMETHAZINE HCL 25 MG/ML IJ SOLN
6.2500 mg | INTRAMUSCULAR | Status: DC | PRN
  Administered 2016-06-11: 12.5 mg via INTRAVENOUS

## 2016-06-11 MED ORDER — ROCURONIUM BROMIDE 100 MG/10ML IV SOLN
INTRAVENOUS | Status: DC | PRN
  Administered 2016-06-11: 30 mg via INTRAVENOUS
  Administered 2016-06-11 (×2): 10 mg via INTRAVENOUS

## 2016-06-11 MED ORDER — MIDAZOLAM HCL 2 MG/2ML IJ SOLN
INTRAMUSCULAR | Status: DC | PRN
  Administered 2016-06-11: 2 mg via INTRAVENOUS

## 2016-06-11 MED ORDER — FENTANYL CITRATE (PF) 100 MCG/2ML IJ SOLN
INTRAMUSCULAR | Status: AC
  Filled 2016-06-11: qty 2

## 2016-06-11 MED ORDER — PHENYLEPHRINE HCL 10 MG/ML IJ SOLN
INTRAMUSCULAR | Status: AC
  Filled 2016-06-11: qty 1

## 2016-06-11 MED ORDER — SEVOFLURANE IN SOLN
RESPIRATORY_TRACT | Status: AC
  Filled 2016-06-11: qty 250

## 2016-06-11 MED ORDER — MEPERIDINE HCL 50 MG/ML IJ SOLN: 6.2500 mg | INTRAMUSCULAR | Status: DC | PRN

## 2016-06-11 MED ORDER — OXYCODONE HCL 5 MG/5ML PO SOLN: 5.0000 mg | Freq: Once | ORAL | Status: AC | PRN

## 2016-06-11 MED ORDER — FAMOTIDINE 20 MG PO TABS
20.0000 mg | ORAL_TABLET | Freq: Once | ORAL | Status: AC
  Administered 2016-06-11: 20 mg via ORAL

## 2016-06-11 MED ORDER — SCOPOLAMINE 1 MG/3DAYS TD PT72
MEDICATED_PATCH | TRANSDERMAL | Status: AC
  Administered 2016-06-11: 1.5 mg via TRANSDERMAL
  Filled 2016-06-11: qty 1

## 2016-06-11 MED ORDER — PROPOFOL 10 MG/ML IV BOLUS
INTRAVENOUS | Status: AC
  Filled 2016-06-11: qty 20

## 2016-06-11 MED ORDER — FENTANYL CITRATE (PF) 100 MCG/2ML IJ SOLN
INTRAMUSCULAR | Status: DC | PRN
  Administered 2016-06-11 (×2): 50 ug via INTRAVENOUS
  Administered 2016-06-11: 100 ug via INTRAVENOUS

## 2016-06-11 MED ORDER — HYDROCODONE-ACETAMINOPHEN 5-325 MG PO TABS: 1.0000 | ORAL_TABLET | ORAL | Status: DC | PRN

## 2016-06-11 MED ORDER — LIDOCAINE HCL (PF) 2 % IJ SOLN
INTRAMUSCULAR | Status: AC
  Filled 2016-06-11: qty 2

## 2016-06-11 MED ORDER — OXYCODONE HCL 5 MG PO TABS
ORAL_TABLET | ORAL | Status: AC
  Filled 2016-06-11: qty 1

## 2016-06-11 MED ORDER — DEXAMETHASONE SODIUM PHOSPHATE 10 MG/ML IJ SOLN
INTRAMUSCULAR | Status: AC
  Filled 2016-06-11: qty 1

## 2016-06-11 MED ORDER — OXYCODONE HCL 5 MG PO TABS
5.0000 mg | ORAL_TABLET | Freq: Once | ORAL | Status: AC | PRN
  Administered 2016-06-11: 5 mg via ORAL

## 2016-06-11 MED ORDER — SUCCINYLCHOLINE CHLORIDE 20 MG/ML IJ SOLN
INTRAMUSCULAR | Status: DC | PRN
  Administered 2016-06-11: 100 mg via INTRAVENOUS

## 2016-06-11 MED ORDER — FENTANYL CITRATE (PF) 100 MCG/2ML IJ SOLN
INTRAMUSCULAR | Status: AC
  Administered 2016-06-11: 50 ug via INTRAVENOUS
  Filled 2016-06-11: qty 2

## 2016-06-11 SURGICAL SUPPLY — 37 items
APPLIER CLIP ROT 10 11.4 M/L (STAPLE) ×3
CANISTER SUCT 1200ML W/VALVE (MISCELLANEOUS) ×3 IMPLANT
CANNULA DILATOR 10 W/SLV (CANNULA) ×2 IMPLANT
CANNULA DILATOR 10MM W/SLV (CANNULA) ×1
CATH REDDICK CHOLANGI 4FR 50CM (CATHETERS) ×3 IMPLANT
CHLORAPREP W/TINT 26ML (MISCELLANEOUS) ×3 IMPLANT
CLIP APPLIE ROT 10 11.4 M/L (STAPLE) ×1 IMPLANT
CLOSURE WOUND 1/2 X4 (GAUZE/BANDAGES/DRESSINGS) ×1
DRAPE SHEET LG 3/4 BI-LAMINATE (DRAPES) ×3 IMPLANT
ELECT REM PT RETURN 9FT ADLT (ELECTROSURGICAL) ×3
ELECTRODE REM PT RTRN 9FT ADLT (ELECTROSURGICAL) ×1 IMPLANT
GAUZE SPONGE 4X4 12PLY STRL (GAUZE/BANDAGES/DRESSINGS) ×3 IMPLANT
GLOVE BIO SURGEON STRL SZ7.5 (GLOVE) ×3 IMPLANT
GOWN STRL REUS W/ TWL LRG LVL3 (GOWN DISPOSABLE) ×4 IMPLANT
GOWN STRL REUS W/TWL LRG LVL3 (GOWN DISPOSABLE) ×8
IRRIGATION STRYKERFLOW (MISCELLANEOUS) ×1 IMPLANT
IRRIGATOR STRYKERFLOW (MISCELLANEOUS) ×3
IV NS 1000ML (IV SOLUTION) ×2
IV NS 1000ML BAXH (IV SOLUTION) ×1 IMPLANT
KIT RM TURNOVER STRD PROC AR (KITS) ×3 IMPLANT
LABEL OR SOLS (LABEL) ×3 IMPLANT
NDL INSUFF ACCESS 14 VERSASTEP (NEEDLE) ×3 IMPLANT
NEEDLE FILTER BLUNT 18X 1/2SAF (NEEDLE) ×2
NEEDLE FILTER BLUNT 18X1 1/2 (NEEDLE) ×1 IMPLANT
NS IRRIG 500ML POUR BTL (IV SOLUTION) ×3 IMPLANT
PACK LAP CHOLECYSTECTOMY (MISCELLANEOUS) ×3 IMPLANT
SCISSORS METZENBAUM CVD 33 (INSTRUMENTS) ×3 IMPLANT
SEAL FOR SCOPE WARMER C3101 (MISCELLANEOUS) ×3 IMPLANT
SLEEVE ENDOPATH XCEL 5M (ENDOMECHANICALS) ×3 IMPLANT
STRIP CLOSURE SKIN 1/2X4 (GAUZE/BANDAGES/DRESSINGS) ×2 IMPLANT
SUT CHROMIC 5 0 RB 1 27 (SUTURE) ×3 IMPLANT
SUT VIC AB 0 CT2 27 (SUTURE) IMPLANT
SYR 3ML LL SCALE MARK (SYRINGE) ×3 IMPLANT
TROCAR XCEL NON-BLD 11X100MML (ENDOMECHANICALS) ×3 IMPLANT
TROCAR XCEL NON-BLD 5MMX100MML (ENDOMECHANICALS) ×3 IMPLANT
TUBING INSUFFLATOR HI FLOW (MISCELLANEOUS) ×3 IMPLANT
WATER STERILE IRR 1000ML POUR (IV SOLUTION) ×3 IMPLANT

## 2016-06-11 NOTE — Transfer of Care (Signed)
Immediate Anesthesia Transfer of Care Note  Patient: Sue Cook  Procedure(s) Performed: Procedure(s): LAPAROSCOPIC CHOLECYSTECTOMY WITH INTRAOPERATIVE CHOLANGIOGRAM (N/A)  Patient Location: PACU  Anesthesia Type:General  Level of Consciousness: sedated  Airway & Oxygen Therapy: Patient Spontanous Breathing and Patient connected to face mask oxygen  Post-op Assessment: Report given to RN and Post -op Vital signs reviewed and stable  Post vital signs: Reviewed and stable  Last Vitals:  Vitals:   06/11/16 0626 06/11/16 0859  BP: 125/83 121/82  Pulse:  74  Resp: 18 16  Temp: 36.9 C 36.7 C    Last Pain:  Vitals:   06/11/16 0626  TempSrc: Oral         Complications: No apparent anesthesia complications

## 2016-06-11 NOTE — Discharge Instructions (Addendum)
Take Tylenol or Norco if needed for pain.  Should not drive or do anything dangerous when taking Norco.  Remove dressings on Wednesday. May shower Thursday.  Avoid straining and heavy lifting for the first week after surgery.  AMBULATORY SURGERY  DISCHARGE INSTRUCTIONS   1) The drugs that you were given will stay in your system until tomorrow so for the next 24 hours you should not:  A) Drive an automobile B) Make any legal decisions C) Drink any alcoholic beverage   2) You may resume regular meals tomorrow.  Today it is better to start with liquids and gradually work up to solid foods.  You may eat anything you prefer, but it is better to start with liquids, then soup and crackers, and gradually work up to solid foods.   3) Please notify your doctor immediately if you have any unusual bleeding, trouble breathing, redness and pain at the surgery site, drainage, fever, or pain not relieved by medication.    4) Additional Instructions:        Please contact your physician with any problems or Same Day Surgery at [phone], Monday through Friday 6 am to 4 pm, or [place] at [facility] Main number at [phone].

## 2016-06-11 NOTE — H&P (Signed)
  She comes today for lap cholecystectomy.  She reports minimal pain since office exam  Labs noted.  Discussed plan for surgery

## 2016-06-11 NOTE — Anesthesia Postprocedure Evaluation (Signed)
Anesthesia Post Note  Patient: Sue Cook  Procedure(s) Performed: Procedure(s) (LRB): LAPAROSCOPIC CHOLECYSTECTOMY WITH INTRAOPERATIVE CHOLANGIOGRAM (N/A)  Patient location during evaluation: PACU Anesthesia Type: General Level of consciousness: awake and alert and oriented Pain management: pain level controlled Vital Signs Assessment: post-procedure vital signs reviewed and stable Respiratory status: spontaneous breathing, nonlabored ventilation and respiratory function stable Cardiovascular status: blood pressure returned to baseline and stable Postop Assessment: no signs of nausea or vomiting Anesthetic complications: no     Last Vitals:  Vitals:   06/11/16 0955 06/11/16 1012  BP: 107/66 118/81  Pulse: 65 64  Resp: 19 16  Temp: 36.4 C 36.8 C    Last Pain:  Vitals:   06/11/16 1012  TempSrc: Oral  PainSc: 6                  Mina Babula

## 2016-06-11 NOTE — Op Note (Signed)
OPERATIVE REPORT  PREOPERATIVE DIAGNOSIS:  Chronic cholecystitis cholelithiasis  POSTOPERATIVE DIAGNOSIS: Chronic cholecystitis cholelithiasis  PROCEDURE: Laparoscopic cholecystectomy with cholangiogram  ANESTHESIA: General  SURGEON: Renda RollsWilton Malina Geers M.D.  INDICATIONS: She has history of epigastric pains and ultrasound findings of gallstones and HIDA scan findings of nonvisualization of the gallbladder. Surgery was recommended for definitive treatment.    With the patient on the operating table in the supine position under general endotracheal anesthesia the abdomen was prepared with ChloraPrep solution and draped in a sterile manner. A short incision was made in the inferior aspect of the umbilicus and carried down to the deep fascia which was grasped with a laryngeal hook. A Veress needle was inserted aspirated and irrigated with a saline solution. The peritoneal cavity was insufflated with carbon dioxide. The Veress needle was removed. The 10 mm cannula was inserted. The 10 mm 0 laparoscope was inserted to view the peritoneal cavity.  Another incision was made in the epigastrium slightly to the right of the midline to introduce an 11 mm cannula. 2 incisions were made in the lateral aspect of the right upper quadrant to introduce 2   5 mm cannulas. Initial inspection revealed a smooth surface of the liver and omentum and some distention of the stomach. The anesthetist insert an oral gastric tube to decompress the stomach. The gallbladder was retracted towards the right shoulder.  The gallbladder neck was retracted inferiorly and laterally.  The porta hepatis was identified. The gallbladder was mobilized with incision of the visceral peritoneum.. The cystic artery was dissected free from surrounding structures and was directly overlying the cystic duct. The cystic artery was controlled with endoclips and divided. Next the cystic duct was dissected free from surrounding structures. An Endo Clip was  placed across the cystic duct adjacent to the gallbladder neck. An incision was made in the cystic duct duct to introduce the Reddick catheter. The Reddick catheter would only go in about 5 mm and it appeared that the cystic duct was obstructed and therefore the cholangiogram was not done and the Reddick catheter was removed. The cystic duct was controlled with double endoclips and divided. The gallbladder was dissected free from the liver with use of hook and cautery and blunt dissection. Bleeding was minimal and hemostasis was intact. The gallbladder was delivered up through the infraumbilical incision opened and suctioned.  Multiple stones were removed with the stone forceps and with the stone scoop. The gallbladder was removed and submitted with stones in formalin for routine pathology. The right upper quadrant was further inspected and could see hemostasis was intact. The cannulas were removed from the abdomen allowing carbon dioxide to escape from the peritoneal cavity. The skin incisions were closed with interrupted 5-0 chromic subcutaneous sutures benzoin and Steri-Strips dressings were applied with paper tape  The patient tolerated surgery satisfactorily and was then prepared for transfer to the recovery room  [employer]ilton Ikhlas Albo M.D.

## 2016-06-11 NOTE — Anesthesia Procedure Notes (Signed)
Procedure Name: Intubation Performed by: Stepheny Canal Pre-anesthesia Checklist: Patient identified, Patient being monitored, Timeout performed, Emergency Drugs available and Suction available Patient Re-evaluated:Patient Re-evaluated prior to inductionOxygen Delivery Method: Circle system utilized Preoxygenation: Pre-oxygenation with 100% oxygen Intubation Type: IV induction Ventilation: Mask ventilation without difficulty Laryngoscope Size: Mac and 3 Grade View: Grade II Tube type: Oral Tube size: 7.0 mm Number of attempts: 1 Airway Equipment and Method: Stylet Placement Confirmation: ETT inserted through vocal cords under direct vision,  positive ETCO2 and breath sounds checked- equal and bilateral Secured at: 22 cm Tube secured with: Tape Dental Injury: Teeth and Oropharynx as per pre-operative assessment        

## 2016-06-11 NOTE — Anesthesia Preprocedure Evaluation (Signed)
Anesthesia Evaluation  Patient identified by MRN, date of birth, ID band Patient awake    Reviewed: Allergy & Precautions, NPO status , Patient's Chart, lab work & pertinent test results  History of Anesthesia Complications (+) PONV and history of anesthetic complications  Airway Mallampati: I  TM Distance: >3 FB Neck ROM: Full    Dental no notable dental hx.    Pulmonary neg pulmonary ROS, Sleep apnea: not diagnosed but suggestive hx. , neg COPD,    breath sounds clear to auscultation- rhonchi (-) wheezing      Cardiovascular Exercise Tolerance: Good (-) hypertension(-) CAD and (-) Past MI  Rhythm:Regular Rate:Normal - Systolic murmurs and - Diastolic murmurs    Neuro/Psych  Headaches, negative psych ROS   GI/Hepatic Neg liver ROS, GERD  ,  Endo/Other  negative endocrine ROSneg diabetes  Renal/GU negative Renal ROS     Musculoskeletal negative musculoskeletal ROS (+)   Abdominal (+) + obese,   Peds  Hematology negative hematology ROS (+)   Anesthesia Other Findings Past Medical History: 05/2016: Ankle pain     Comment: RIGHT-PT SEEING DR Ether GriffinsFOWLER AND GETTING               CORTISONE INJECTIONS No date: Complication of anesthesia No date: GERD (gastroesophageal reflux disease)     Comment: rare-NO MEDS No date: Headache     Comment: MIGRAINES No date: PONV (postoperative nausea and vomiting)   Reproductive/Obstetrics                             Anesthesia Physical Anesthesia Plan  ASA: II  Anesthesia Plan: General   Post-op Pain Management:    Induction: Intravenous  Airway Management Planned: Oral ETT  Additional Equipment:   Intra-op Plan:   Post-operative Plan: Extubation in OR  Informed Consent: I have reviewed the patients History and Physical, chart, labs and discussed the procedure including the risks, benefits and alternatives for the proposed anesthesia with the  patient or authorized representative who has indicated his/her understanding and acceptance.   Dental advisory given  Plan Discussed with: CRNA and Anesthesiologist  Anesthesia Plan Comments:         Anesthesia Quick Evaluation

## 2016-06-11 NOTE — Anesthesia Post-op Follow-up Note (Cosign Needed)
Anesthesia QCDR form completed.        

## 2016-06-12 LAB — SURGICAL PATHOLOGY

## 2018-01-26 DIAGNOSIS — Z6841 Body Mass Index (BMI) 40.0 and over, adult: Secondary | ICD-10-CM | POA: Insufficient documentation

## 2018-02-11 ENCOUNTER — Encounter: Payer: Self-pay | Admitting: Obstetrics and Gynecology

## 2018-02-11 ENCOUNTER — Ambulatory Visit (INDEPENDENT_AMBULATORY_CARE_PROVIDER_SITE_OTHER): Payer: BLUE CROSS/BLUE SHIELD | Admitting: Obstetrics and Gynecology

## 2018-02-11 ENCOUNTER — Other Ambulatory Visit (HOSPITAL_COMMUNITY)
Admission: RE | Admit: 2018-02-11 | Discharge: 2018-02-11 | Disposition: A | Discharge status: Home / Self Care | Payer: BLUE CROSS/BLUE SHIELD | Source: Ambulatory Visit | Attending: Obstetrics and Gynecology | Admitting: Obstetrics and Gynecology

## 2018-02-11 VITALS — BP 127/81 | HR 78 | Ht 65.0 in | Wt 281.1 lb

## 2018-02-11 DIAGNOSIS — Z23 Encounter for immunization: Secondary | ICD-10-CM | POA: Diagnosis not present

## 2018-02-11 DIAGNOSIS — Z01419 Encounter for gynecological examination (general) (routine) without abnormal findings: Secondary | ICD-10-CM | POA: Diagnosis not present

## 2018-02-11 DIAGNOSIS — Z803 Family history of malignant neoplasm of breast: Secondary | ICD-10-CM

## 2018-02-11 DIAGNOSIS — Z6841 Body Mass Index (BMI) 40.0 and over, adult: Secondary | ICD-10-CM

## 2018-02-11 NOTE — Patient Instructions (Addendum)
 Preventive Care 18-39 Years, Female Preventive care refers to lifestyle choices and visits with your health care provider that can promote health and wellness. What does preventive care include?   A yearly physical exam. This is also called an annual well check.  Dental exams once or twice a year.  Routine eye exams. Ask your health care provider how often you should have your eyes checked.  Personal lifestyle choices, including: ? Daily care of your teeth and gums. ? Regular physical activity. ? Eating a healthy diet. ? Avoiding tobacco and drug use. ? Limiting alcohol use. ? Practicing safe sex. ? Taking vitamin and mineral supplements as recommended by your health care provider. What happens during an annual well check? The services and screenings done by your health care provider during your annual well check will depend on your age, overall health, lifestyle risk factors, and family history of disease. Counseling Your health care provider may ask you questions about your:  Alcohol use.  Tobacco use.  Drug use.  Emotional well-being.  Home and relationship well-being.  Sexual activity.  Eating habits.  Work and work environment.  Method of birth control.  Menstrual cycle.  Pregnancy history. Screening You may have the following tests or measurements:  Height, weight, and BMI.  Diabetes screening. This is done by checking your blood sugar (glucose) after you have not eaten for a while (fasting).  Blood pressure.  Lipid and cholesterol levels. These may be checked every 5 years starting at age 20.  Skin check.  Hepatitis C blood test.  Hepatitis B blood test.  Sexually transmitted disease (STD) testing.  BRCA-related cancer screening. This may be done if you have a family history of breast, ovarian, tubal, or peritoneal cancers.  Pelvic exam and Pap test. This may be done every 3 years starting at age 21. Starting at age 30, this may be done  every 5 years if you have a Pap test in combination with an HPV test. Discuss your test results, treatment options, and if necessary, the need for more tests with your health care provider. Vaccines Your health care provider may recommend certain vaccines, such as:  Influenza vaccine. This is recommended every year.  Tetanus, diphtheria, and acellular pertussis (Tdap, Td) vaccine. You may need a Td booster every 10 years.  Varicella vaccine. You may need this if you have not been vaccinated.  HPV vaccine. If you are 26 or younger, you may need three doses over 6 months.  Measles, mumps, and rubella (MMR) vaccine. You may need at least one dose of MMR. You may also need a second dose.  Pneumococcal 13-valent conjugate (PCV13) vaccine. You may need this if you have certain conditions and were not previously vaccinated.  Pneumococcal polysaccharide (PPSV23) vaccine. You may need one or two doses if you smoke cigarettes or if you have certain conditions.  Meningococcal vaccine. One dose is recommended if you are age 19-21 years and a first-year college student living in a residence hall, or if you have one of several medical conditions. You may also need additional booster doses.  Hepatitis A vaccine. You may need this if you have certain conditions or if you travel or work in places where you may be exposed to hepatitis A.  Hepatitis B vaccine. You may need this if you have certain conditions or if you travel or work in places where you may be exposed to hepatitis B.  Haemophilus influenzae type b (Hib) vaccine. You may need this if   you have certain risk factors. Talk to your health care provider about which screenings and vaccines you need and how often you need them. This information is not intended to replace advice given to you by your health care provider. Make sure you discuss any questions you have with your health care provider. Document Released: 03/05/2001 Document Revised:  08/20/2016 Document Reviewed: 11/08/2014 Elsevier Interactive Patient Education  2019 [place] you for enrolling in [place]. Please follow the instructions below to securely access your online medical record. MyChart allows you to send messages to your doctor, view your test results, renew your prescriptions, schedule appointments, and more.  How Do I Sign Up? 1. In your Internet browser, go to [email or web address]. 2. Click on the New  User? link in the Sign In box.  3. Enter your MyChart Access Code exactly as it appears below. You will not need to use this code after you have completed the sign-up process. If you do not sign up before the expiration date, you must request a new code. MyChart Access Code: [email or web address] Expires: 03/28/2018  4:16 PM  4. Enter the last four digits of your Social Security Number (xxxx) and Date of Birth (mm/dd/yyyy) as indicated and click Next. You will be taken to the next sign-up page. 5. Create a MyChart ID. This will be your MyChart login ID and cannot be changed, so think of one that is secure and easy to remember. 6. Create a MyChart password. You can change your password at any time. 7. Enter your Password Reset Question and Answer and click Next. This can be used at a later time if you forget your password.  8. Select your communication preference, and if applicable enter your e-mail address. You will receive e-mail notification when new information is available in MyChart by choosing to receive e-mail notifications and filling in your e-mail. 9. Click Sign In. You can now view your medical record.   Additional Information If you have questions, you can email [email or web address] or call [phone] to talk to our [facility] staff. Remember, MyChart is NOT to be used for urgent needs. For medical emergencies, dial 911.

## 2018-02-11 NOTE — Progress Notes (Signed)
Subjective:   Sue Cook is a 38 y.o. 324P0010 Caucasian female here for a routine well-woman exam.  No LMP recorded. Patient has had a hysterectomy.   Desires trial of weight loss program. Current complaints: none PCP: Sue Cook       does desire labs  Social History: Sexual: heterosexual Marital Status: married Living situation: with family Occupation: sheets distribution lead Tobacco/alcohol: no tobacco use Illicit drugs: no history of illicit drug use  The following portions of the patient's history were reviewed and updated as appropriate: allergies, current medications, past family history, past medical history, past social history, past surgical history and problem list.  Past Medical History Past Medical History:  Diagnosis Date  . Ankle pain 05/2016   RIGHT-PT SEEING DR FOWLER AND GETTING CORTISONE INJECTIONS  . Complication of anesthesia   . GERD (gastroesophageal reflux disease)    rare-NO MEDS  . Headache    MIGRAINES  . PONV (postoperative nausea and vomiting)     Past Surgical History Past Surgical History:  Procedure Laterality Date  . ABDOMINAL HYSTERECTOMY    . CHOLECYSTECTOMY N/A 06/11/2016   Procedure: LAPAROSCOPIC CHOLECYSTECTOMY WITH INTRAOPERATIVE CHOLANGIOGRAM;  Surgeon: Nadeen LandauSmith, Jarvis Wilton, MD;  Location: [facility] ORS;  Service: General;  Laterality: N/A;  . ENDOMETRIAL BIOPSY    . WISDOM TOOTH EXTRACTION      Gynecologic History G4P0010  No LMP recorded. Patient has had a hysterectomy. Contraception: status post hysterectomy Last Pap: 2017?Marland Kitchen. Results were: normal Last mammogram: >5 years ago. Results were: normal  Obstetric History OB History  Gravida Para Term Preterm AB Living  4 3     1     SAB TAB Ectopic Multiple Live Births    1          # Outcome Date GA Lbr Len/2nd Weight Sex Delivery Anes PTL Lv  4 Para 2007    F Vag-Spont     3 TAB 2005          2 Para 2003    F Vag-Spont     1 Para 2002    M Vag-Spont       Current  Medications Current Outpatient Medications on File Prior to Visit  Medication Sig Dispense Refill  . ibuprofen (ADVIL,MOTRIN) 200 MG tablet Take 600 mg by mouth every 6 (six) hours as needed for mild pain.    . SUMAtriptan (IMITREX) 100 MG tablet TAKE 1 TABLET BY MOUTH ONCE AS NEEDED FOR MIGRAINE. MAY TAKE SECOND DOSE AFTER 2 HRS IF NEEDED  0   No current facility-administered medications on file prior to visit.     Review of Systems Patient denies any headaches, blurred vision, shortness of breath, chest pain, abdominal pain, problems with bowel movements, urination, or intercourse.  Objective:  BP 127/81   Pulse 78   Ht 5\' 5"  (1.651 m)   Wt 281 lb 1.6 oz (127.5 kg)   BMI 46.78 kg/m  Physical Exam  General:  Well developed, well nourished, no acute distress. She is alert and oriented x3. Skin:  Warm and dry Neck:  Midline trachea, no thyromegaly or nodules Cardiovascular: Regular rate and rhythm, no murmur heard Lungs:  Effort normal, all lung fields clear to auscultation bilaterally Breasts:  No dominant palpable mass, retraction, or nipple discharge Abdomen:  Soft, non tender, no hepatosplenomegaly or masses Pelvic:  External genitalia is normal in appearance.  The vagina is normal in appearance. The cervix is bulbous, no CMT.  Thin prep pap is done with HR  HPV cotesting. Uterus is felt to be normal size, shape, and contour.  No adnexal masses or tenderness noted. Extremities:  No swelling or varicosities noted Psych:  She has a normal mood and affect  Assessment:   Healthy well-woman exam Obesity/BMI 46 Needs flu vaccine  Plan:  Labs obtained- will follow up accordingly Flu vaccine given RTC 1 week to start weight loss program F/U 1 ear for AE, or sooner if needed Mammogram baseline ordered  Sue Cook, CNM

## 2018-02-12 LAB — THYROID PANEL WITH TSH
Free Thyroxine Index: 1.8 (ref 1.2–4.9)
T3 UPTAKE RATIO: 21 % — AB (ref 24–39)
T4, Total: 8.6 ug/dL (ref 4.5–12.0)
TSH: 1.16 u[IU]/mL (ref 0.450–4.500)

## 2018-02-12 LAB — COMPREHENSIVE METABOLIC PANEL
ALK PHOS: 66 IU/L (ref 39–117)
ALT: 20 IU/L (ref 0–32)
AST: 12 IU/L (ref 0–40)
Albumin/Globulin Ratio: 2.1 (ref 1.2–2.2)
Albumin: 4.7 g/dL (ref 3.8–4.8)
BILIRUBIN TOTAL: 0.4 mg/dL (ref 0.0–1.2)
BUN/Creatinine Ratio: 22 (ref 9–23)
BUN: 17 mg/dL (ref 6–20)
CALCIUM: 9.7 mg/dL (ref 8.7–10.2)
CHLORIDE: 101 mmol/L (ref 96–106)
CO2: 25 mmol/L (ref 20–29)
Creatinine, Ser: 0.77 mg/dL (ref 0.57–1.00)
GFR calc Af Amer: 113 mL/min/{1.73_m2} (ref >59)
GFR calc non Af Amer: 98 mL/min/{1.73_m2} (ref >59)
GLUCOSE: 96 mg/dL (ref 65–99)
Globulin, Total: 2.2 g/dL (ref 1.5–4.5)
Potassium: 4.1 mmol/L (ref 3.5–5.2)
SODIUM: 141 mmol/L (ref 134–144)
Total Protein: 6.9 g/dL (ref 6.0–8.5)

## 2018-02-12 LAB — LIPID PANEL
Chol/HDL Ratio: 3.5 ratio (ref 0.0–4.4)
Cholesterol, Total: 169 mg/dL (ref 100–199)
HDL: 48 mg/dL (ref >39)
LDL CALC: 101 mg/dL — AB (ref 0–99)
Triglycerides: 100 mg/dL (ref 0–149)
VLDL CHOLESTEROL CAL: 20 mg/dL (ref 5–40)

## 2018-02-12 LAB — HEMOGLOBIN A1C
Est. average glucose Bld gHb Est-mCnc: 94 mg/dL
HEMOGLOBIN A1C: 4.9 % (ref 4.8–5.6)

## 2018-02-17 LAB — CYTOLOGY - PAP
DIAGNOSIS: NEGATIVE
HPV (WINDOPATH): NOT DETECTED

## 2018-02-19 ENCOUNTER — Ambulatory Visit (INDEPENDENT_AMBULATORY_CARE_PROVIDER_SITE_OTHER): Payer: BLUE CROSS/BLUE SHIELD | Admitting: Obstetrics and Gynecology

## 2018-02-19 ENCOUNTER — Encounter: Payer: Self-pay | Admitting: Obstetrics and Gynecology

## 2018-02-19 ENCOUNTER — Encounter: Payer: BLUE CROSS/BLUE SHIELD | Admitting: Obstetrics and Gynecology

## 2018-02-19 VITALS — BP 120/80 | HR 89 | Ht 65.0 in | Wt 280.7 lb

## 2018-02-19 DIAGNOSIS — Z6841 Body Mass Index (BMI) 40.0 and over, adult: Secondary | ICD-10-CM | POA: Diagnosis not present

## 2018-02-19 MED ORDER — CYANOCOBALAMIN 1000 MCG/ML IJ SOLN: 1000.0000 ug | INTRAMUSCULAR | 1 refills | Status: DC

## 2018-02-19 MED ORDER — PHENTERMINE HCL 37.5 MG PO TABS: 37.5000 mg | ORAL_TABLET | Freq: Every day | ORAL | 2 refills | Status: DC

## 2018-02-19 NOTE — Patient Instructions (Addendum)
WE WOULD LOVE TO HEAR FROM YOU!!!!   Thank you Barnabas Harries for visiting [facility].  Providing our patients with the best experience possible is really important to [place], and we hope that you felt that on your recent visit. The most valuable feedback we get comes from [place]!!    If you receive a survey please take a couple of minutes to let us know how we did.Thank you for continuing to trust [place] with your care.   [facility]  Consider the Low Glycemic Index Diet and 6 smaller meals daily .  This boosts your metabolism and regulates your sugars:   Use the protein bar by Atkins because they have lots of fiber in them  Find the low carb flatbreads, tortillas and pita breads for sandwiches:  Joseph's makes a pita bread and a flat bread , available at [facility] and [employer]J's; [place] makes a low carb flatbread available at [employer] and HT that is 9 net carbs and 100 cal [place] makes a low carb whole wheat tortilla available at [employer] most grocery stores with 6 net carbs and 210 cal  [place] yogurt can still have a lot of carbs .  Dannon Light N fit has 80 cal and 8 carbs    Healthy Eating Following a healthy eating pattern may help you to achieve and maintain a healthy body weight, reduce the risk of chronic disease, and live a long and productive life. It is important to follow a healthy eating pattern at an appropriate calorie level for your body. Your nutritional needs should be met primarily through food by choosing a variety of nutrient-rich foods. What are tips for following this plan? Reading food labels  Read labels and choose the following: ? Reduced or low sodium. ? Juices with 100% fruit juice. ? Foods with low saturated fats and high polyunsaturated and monounsaturated fats. ? Foods with whole grains, such as whole wheat, cracked wheat, brown rice, and wild rice. ? Whole grains that are fortified with folic acid. This is recommended for  women who are pregnant or who want to become pregnant.  Read labels and avoid the following: ? Foods with a lot of added sugars. These include foods that contain brown sugar, corn sweetener, corn syrup, dextrose, fructose, glucose, high-fructose corn syrup, honey, invert sugar, lactose, malt syrup, maltose, molasses, raw sugar, sucrose, trehalose, or turbinado sugar.  Do not eat more than the following amounts of added sugar per day:  6 teaspoons (25 g) for women.  9 teaspoons (38 g) for men. ? Foods that contain processed or refined starches and grains. ? Refined grain products, such as white flour, degermed cornmeal, white bread, and white rice. Shopping  Choose nutrient-rich snacks, such as vegetables, whole fruits, and nuts. Avoid high-calorie and high-sugar snacks, such as potato chips, fruit snacks, and candy.  Use oil-based dressings and spreads on foods instead of solid fats such as butter, stick margarine, or cream cheese.  Limit pre-made sauces, mixes, and "instant" products such as flavored rice, instant noodles, and ready-made pasta.  Try more plant-protein sources, such as tofu, tempeh, black beans, edamame, lentils, nuts, and seeds.  Explore eating plans such as the Mediterranean diet or vegetarian diet. Cooking  Use oil to saut or stir-fry foods instead of solid fats such as butter, stick margarine, or lard.  Try baking, boiling, grilling, or broiling instead of frying.  Remove the fatty part of meats before cooking.  Steam vegetables in water or  broth. Meal planning   At meals, imagine dividing your plate into fourths: ? One-half of your plate is fruits and vegetables. ? One-fourth of your plate is whole grains. ? One-fourth of your plate is protein, especially lean meats, poultry, eggs, tofu, beans, or nuts.  Include low-fat dairy as part of your daily diet. Lifestyle  Choose healthy options in all settings, including home, work, school, restaurants, or  stores.  Prepare your food safely: ? Wash your hands after handling raw meats. ? Keep food preparation surfaces clean by regularly washing with hot, soapy water. ? Keep raw meats separate from ready-to-eat foods, such as fruits and vegetables. ? Cook seafood, meat, poultry, and eggs to the recommended internal temperature. ? Store foods at safe temperatures. In general:  Keep cold foods at 73F (4.4C) or below.  Keep hot foods at 173F (60C) or above.  Keep your freezer at [facility] (-17.8C) or below.  Foods are no longer safe to eat when they have been between the temperatures of 40-173F (4.4-60C) for more than 2 hours. What foods should I eat? Fruits Aim to eat 2 cup-equivalents of fresh, canned (in natural juice), or frozen fruits each day. Examples of 1 cup-equivalent of fruit include 1 small apple, 8 large strawberries, 1 cup canned fruit,  cup dried fruit, or 1 cup 100% juice. Vegetables Aim to eat 2-3 cup-equivalents of fresh and frozen vegetables each day, including different varieties and colors. Examples of 1 cup-equivalent of vegetables include 2 medium carrots, 2 cups raw, leafy greens, 1 cup chopped vegetable (raw or cooked), or 1 medium baked potato. Grains Aim to eat 6 ounce-equivalents of whole grains each day. Examples of 1 ounce-equivalent of grains include 1 slice of bread, 1 cup ready-to-eat cereal, 3 cups popcorn, or  cup cooked rice, pasta, or cereal. Meats and other proteins Aim to eat 5-6 ounce-equivalents of protein each day. Examples of 1 ounce-equivalent of protein include 1 egg, 1/2 cup nuts or seeds, or 1 tablespoon (16 g) peanut butter. A cut of meat or fish that is the size of a deck of cards is about 3-4 ounce-equivalents.  Of the protein you eat each week, try to have at least 8 ounces come from seafood. This includes salmon, trout, herring, and anchovies. Dairy Aim to eat 3 cup-equivalents of fat-free or low-fat dairy each day. Examples of 1  cup-equivalent of dairy include 1 cup (240 mL) milk, 8 ounces (250 g) yogurt, 1 ounces (44 g) natural cheese, or 1 cup (240 mL) fortified soy milk. Fats and oils  Aim for about 5 teaspoons (21 g) per day. Choose monounsaturated fats, such as canola and olive oils, avocados, peanut butter, and most nuts, or polyunsaturated fats, such as sunflower, corn, and soybean oils, walnuts, pine nuts, sesame seeds, sunflower seeds, and flaxseed. Beverages  Aim for six 8-oz glasses of water per day. Limit coffee to three to five 8-oz cups per day.  Limit caffeinated beverages that have added calories, such as soda and energy drinks.  Limit alcohol intake to no more than 1 drink a day for nonpregnant women and 2 drinks a day for men. One drink equals 12 oz of beer (355 mL), 5 oz of wine (148 mL), or 1 oz of hard liquor (44 mL). Seasoning and other foods  Avoid adding excess amounts of salt to your foods. Try flavoring foods with herbs and spices instead of salt.  Avoid adding sugar to foods.  Try using oil-based dressings, sauces, and spreads instead  of solid fats. This information is based on general [place] nutrition guidelines. For more information, visit BuildDNA.es. Exact amounts may vary based on your nutrition needs. Summary  A healthy eating plan may help you to maintain a healthy weight, reduce the risk of chronic diseases, and stay active throughout your life.  Plan your meals. Make sure you eat the right portions of a variety of nutrient-rich foods.  Try baking, boiling, grilling, or broiling instead of frying.  Choose healthy options in all settings, including home, work, school, restaurants, or stores. This information is not intended to replace advice given to you by your health care provider. Make sure you discuss any questions you have with your health care provider. Document Released: 04/21/2017 Document Revised: 04/21/2017 Document Reviewed: 04/21/2017 Elsevier Interactive  Patient Education  2019 [employer].

## 2018-02-19 NOTE — Progress Notes (Signed)
Waist- 45 in

## 2018-02-19 NOTE — Progress Notes (Signed)
Subjective:  Sue Cook is a 38 y.o. G4P0010 at Unknown being seen today for weight loss management- initial visit.  Patient reports General ROS: negative  Onset was gradual over several year(s) ago.  Onset followed: recent pregnancy, Associated symptoms include: fatigue,  change in clothing Cook.   The patient has a surgical history of cholecystectomy and hysterectomy.   Past evaluation has included: metabolic profile, hemoglobin A1c, thyroid panel  And lipid panel  Working FT daily, gets up at 4am and typically eats breakfast around 8 am. Does has a cup of coffee with creamer on the way to work. Then eats lunch around one, and dinner at 6-7. Goes to bed around 9. Is not currently exercising, but is on feet all day at work.  The following portions of the patient's history were reviewed and updated as appropriate: allergies, current medications, past family history, past medical history, past social history, past surgical history and problem list.   Objective:   Vitals:   02/19/18 1409  BP: 120/80  Pulse: 89  Weight: 280 lb 11.2 oz (127.3 kg)  Height: 5\' 5"  (1.651 m)    General:  Alert, oriented and cooperative. Patient is in no acute distress.  :   :   :   :   :   :   PE: Well groomed female in no current distress,   Mental Status: Normal mood and affect. Normal behavior. Normal judgment and thought content.   Current BMI: Body mass index is 46.71 kg/m.   Assessment and Plan:  Obesity  1. BMI 45.0-49.9, adult (HCC)                   Plan: low carb, High protein diet RX for adipex 37.5 mg daily and B12 .ml monthly, to start now with first injection given at today's visit. Reviewed side-effects common to both medications and expected outcomes. Increase daily water intake to at least 8 bottle a day, every day.  Goal is to reduse weight by 10% (28#s) by end of three months, and will re-evaluate then.  RTC in 4 weeks for Nurse visit to check weight & BP, and  get next B12 injections.    Please refer to After Visit Summary for other counseling recommendations.    [place], Sue Cook, CNM   Sue Cook, CNM      Consider the Low Glycemic Index Diet and 6 smaller meals daily .  This boosts your metabolism and regulates your sugars:   Use the protein bar by Atkins because they have lots of fiber in them  Find the low carb flatbreads, tortillas and pita breads for sandwiches:  Sue Cook makes a pita bread and a flat bread , available at [employer] and [employer]J's; Sue Cook makes a low carb flatbread available at [employer] and HT that is 9 net carbs and 100 cal [place] makes a low carb whole wheat tortilla available at [employer] most grocery stores with 6 net carbs and 210 cal  Sue Cook yogurt can still have a lot of carbs .  Sue Cook has 80 cal and 8 carbs

## 2018-03-18 ENCOUNTER — Encounter: Payer: BLUE CROSS/BLUE SHIELD | Admitting: Obstetrics and Gynecology

## 2018-03-19 ENCOUNTER — Encounter: Payer: BLUE CROSS/BLUE SHIELD | Admitting: Obstetrics and Gynecology

## 2018-10-14 ENCOUNTER — Other Ambulatory Visit: Payer: Self-pay

## 2018-10-14 DIAGNOSIS — Z20822 Contact with and (suspected) exposure to covid-19: Secondary | ICD-10-CM

## 2018-10-16 LAB — NOVEL CORONAVIRUS, NAA: SARS-CoV-2, NAA: NOT DETECTED

## 2018-11-17 ENCOUNTER — Ambulatory Visit (INDEPENDENT_AMBULATORY_CARE_PROVIDER_SITE_OTHER): Payer: BC Managed Care – PPO

## 2018-11-17 ENCOUNTER — Ambulatory Visit: Payer: BC Managed Care – PPO | Admitting: Podiatry

## 2018-11-17 ENCOUNTER — Other Ambulatory Visit: Payer: Self-pay | Admitting: Podiatry

## 2018-11-17 ENCOUNTER — Encounter: Payer: Self-pay | Admitting: Podiatry

## 2018-11-17 ENCOUNTER — Other Ambulatory Visit: Payer: Self-pay

## 2018-11-17 DIAGNOSIS — M25571 Pain in right ankle and joints of right foot: Secondary | ICD-10-CM

## 2018-11-17 DIAGNOSIS — M659 Synovitis and tenosynovitis, unspecified: Secondary | ICD-10-CM

## 2018-11-17 DIAGNOSIS — M722 Plantar fascial fibromatosis: Secondary | ICD-10-CM

## 2018-11-19 NOTE — Progress Notes (Signed)
   HPI: 38 y.o. female presenting today as a new patient with a chief complaint of severe intermittent pain noted to the right Achilles and lateral ankle that began 2-3 years ago. She reports associated stiffness of the area. Working, walking and moving the foot and ankle increases the pain. She has been seen by multiple doctors in the past and received injections, braces and inserts for treatment. Patient is here for further evaluation and treatment.    Past Medical History:  Diagnosis Date  . Ankle pain 05/2016   RIGHT-PT SEEING DR FOWLER AND GETTING CORTISONE INJECTIONS  . Complication of anesthesia   . GERD (gastroesophageal reflux disease)    rare-NO MEDS  . Headache    MIGRAINES  . PONV (postoperative nausea and vomiting)       Physical Exam: General: The patient is alert and oriented x3 in no acute distress.  Dermatology: Skin is warm, dry and supple bilateral lower extremities. Negative for open lesions or macerations.  Vascular: Palpable pedal pulses bilaterally. No edema or erythema noted. Capillary refill within normal limits.  Neurological: Epicritic and protective threshold grossly intact bilaterally.   Musculoskeletal Exam: Pain on palpation noted to the posterior tubercle of the right calcaneus at the insertion of the Achilles tendon consistent with retrocalcaneal bursitis. Pain on palpation and range of motion to the sinus tarsi right. Muscle strength 5/5 in all muscle groups bilateral lower extremities.   Radiographic Exam:  Posterior calcaneal spur noted to the respective calcaneus on lateral view. No fracture or dislocation noted. Normal osseous mineralization noted.     Assessment: 1. Insertional Achilles tendinitis right - chronic  2. Sinus tarsitis right - chronic    Plan of Care:  1. Patient was evaluated. Radiographs were reviewed today. 2. MRI of the right ankle ordered.  3. Continue taking Meloxicam as needed.  4. Recommended good shoe gear.  5.  Return to clinic after MRI to review results.   Works at [employer].    Edrick Kins, DPM [facility]  Dr. Edrick Kins, [place]                                        [place],  [address]                Office [phone]  Fax [phone]

## 2018-11-23 ENCOUNTER — Telehealth: Payer: Self-pay

## 2018-11-23 DIAGNOSIS — M659 Synovitis and tenosynovitis, unspecified: Secondary | ICD-10-CM

## 2018-11-23 DIAGNOSIS — M25571 Pain in right ankle and joints of right foot: Secondary | ICD-10-CM

## 2018-11-23 NOTE — Telephone Encounter (Signed)
-----   Message from Sue Cook, DPM sent at 11/17/2018 11:01 AM EDT ----- Regarding: MRI right ankle Please order MRI right ankle.   Dx: sinus tarsitis right. Achilles tendinitis right. PT tendinitis RT. Chronic.   Dr. Amalia Hailey

## 2018-11-23 NOTE — Telephone Encounter (Signed)
Per Hermina Staggers  No precert required for MRI Reference # [id number] Patient notified via voice mail to call scheduling and set up appt to her convenience.

## 2018-12-03 ENCOUNTER — Other Ambulatory Visit: Payer: Self-pay

## 2018-12-03 ENCOUNTER — Ambulatory Visit
Admission: RE | Admit: 2018-12-03 | Discharge: 2018-12-03 | Disposition: A | Discharge status: Home / Self Care | Payer: BC Managed Care – PPO | Source: Ambulatory Visit | Attending: Podiatry | Admitting: Podiatry

## 2018-12-03 DIAGNOSIS — M25571 Pain in right ankle and joints of right foot: Secondary | ICD-10-CM | POA: Insufficient documentation

## 2018-12-03 DIAGNOSIS — M659 Synovitis and tenosynovitis, unspecified: Secondary | ICD-10-CM | POA: Insufficient documentation

## 2018-12-03 IMAGING — MR MR ANKLE*R* W/O CM
5 series · 40 of 40 positions shown · non-contrast
Comparison: None.

CLINICAL DATA: Right heel pain along the dorsal aspect. Difficulty
walking.

EXAM:
MRI OF THE RIGHT ANKLE WITHOUT CONTRAST
TECHNIQUE: Multiplanar, multisequence MR imaging of the ankle was performed. No
intravenous contrast was administered.

[Series 3: PD fat-sat · axial · right · 3.0mm · 0.50mm/px · z∈[-71,+68]mm · 9 of 36 slices shown]
[im 1/36]
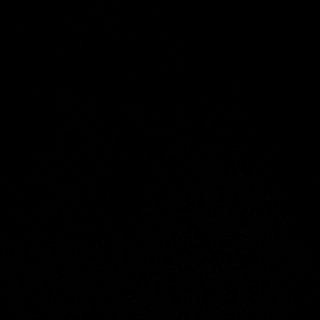
[im 5/36]
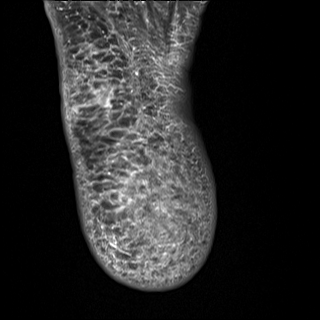
[im 9/36]
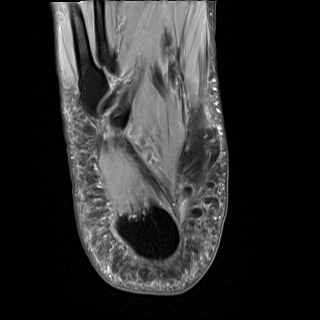
[im 14/36]
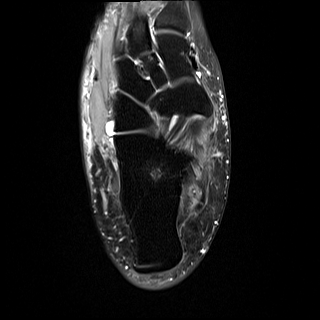
[im 18/36]
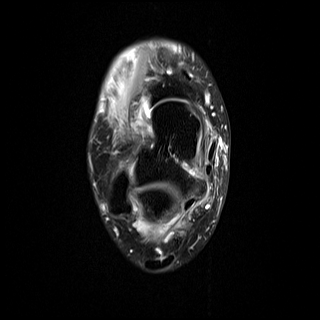
[im 22/36]
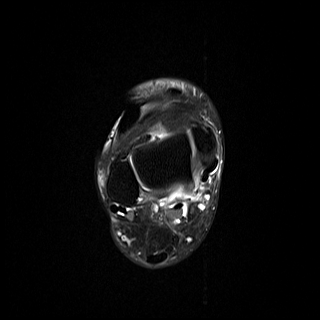
[im 27/36]
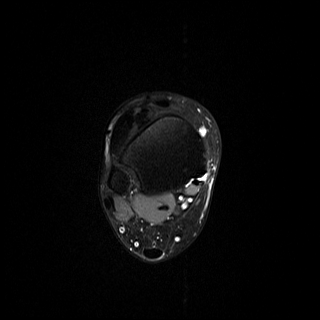
[im 31/36]
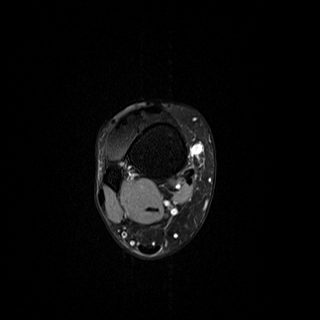
[im 36/36]
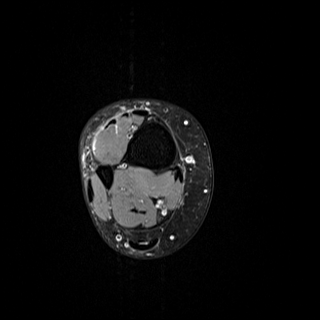

[Series 4: T2 fat-sat · axial · right · 3.0mm · 0.50mm/px · z∈[-71,+68]mm · 9 of 36 slices shown (1 of 2)]
[im 1/36]
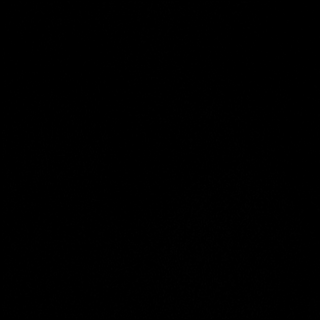
[im 5/36]
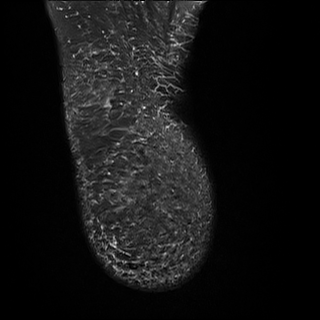
[im 9/36]
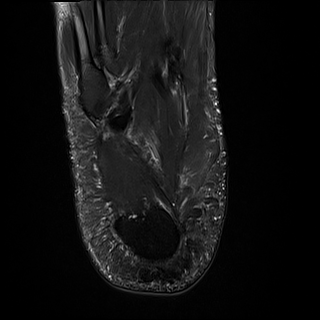
[im 14/36]
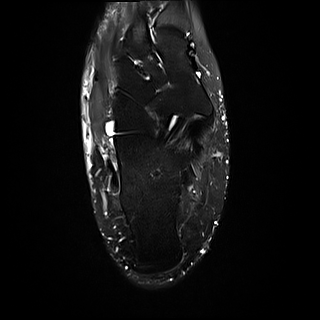
[im 18/36]
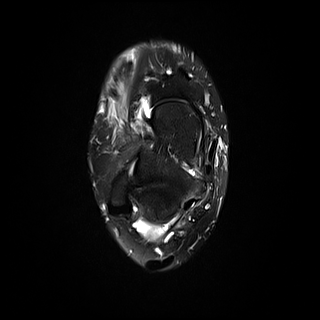
[im 22/36]
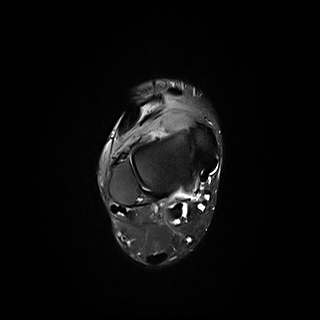
[im 27/36]
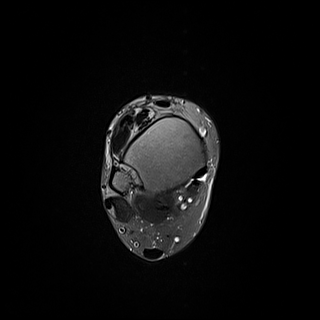
[im 31/36]
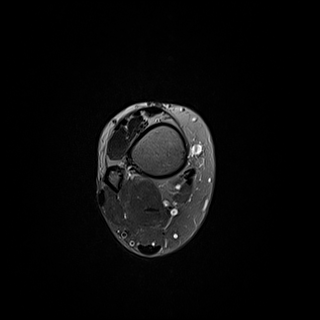
[im 36/36]
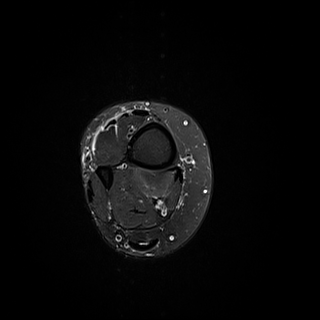

[Series 5: T2 fat-sat · coronal · right · 3.0mm · 0.62mm/px · 10 of 40 slices shown (2 of 2)]
[im 1/40]
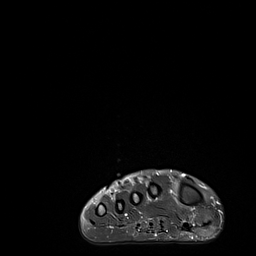
[im 5/40]
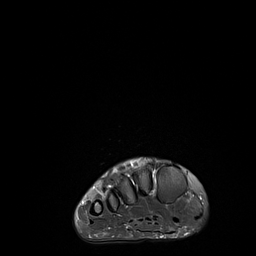
[im 9/40]
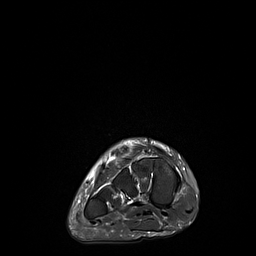
[im 14/40]
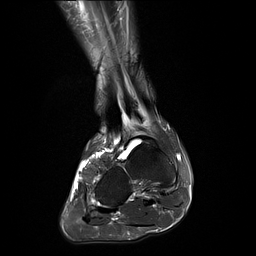
[im 18/40]
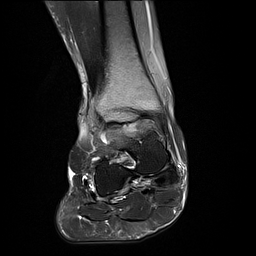
[im 22/40]
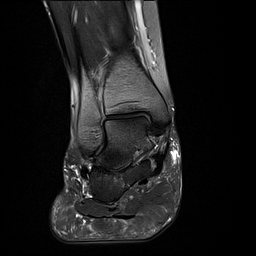
[im 27/40]
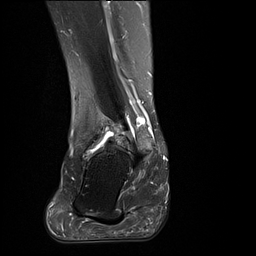
[im 31/40]
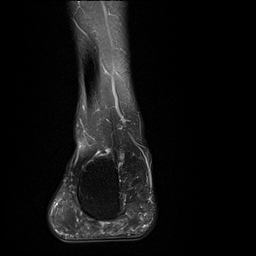
[im 35/40]
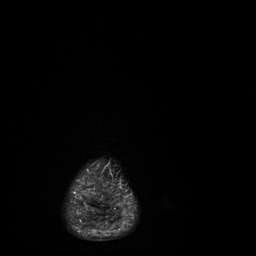
[im 40/40]
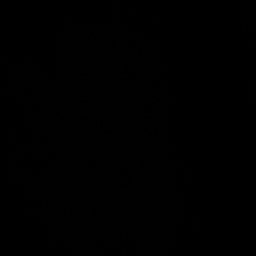

[Series 6: T1 · sagittal · right · 4.0mm · 0.70mm/px · 6 of 24 slices shown]
[im 1/24]
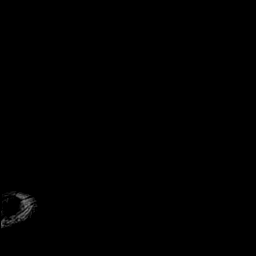
[im 5/24]
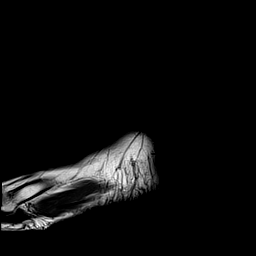
[im 10/24]
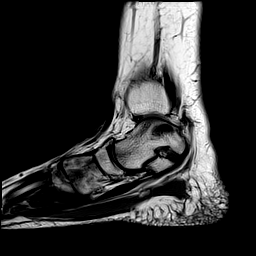
[im 14/24]
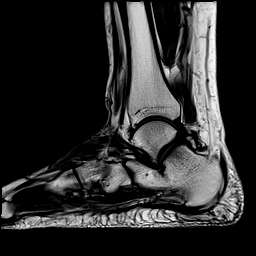
[im 19/24]
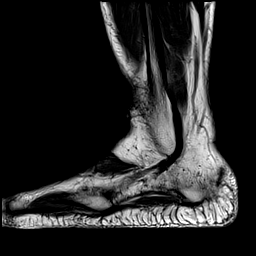
[im 24/24]
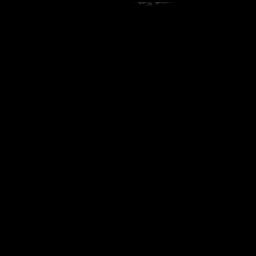

[Series 7: STIR · sagittal · right · 4.0mm · 0.35mm/px · 6 of 24 slices shown]
[im 1/24]
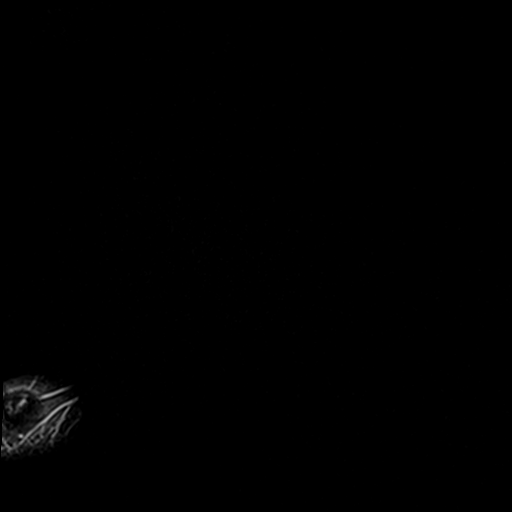
[im 5/24]
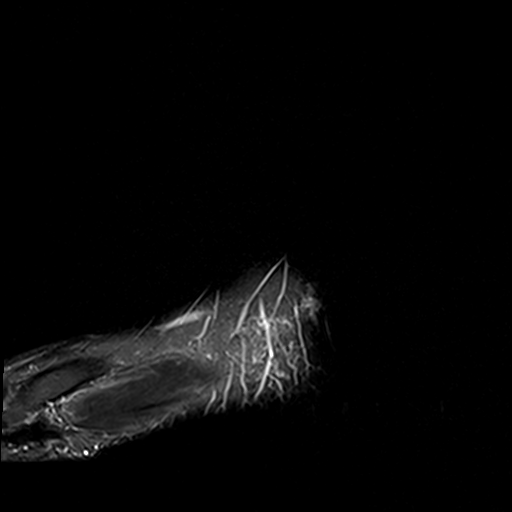
[im 10/24]
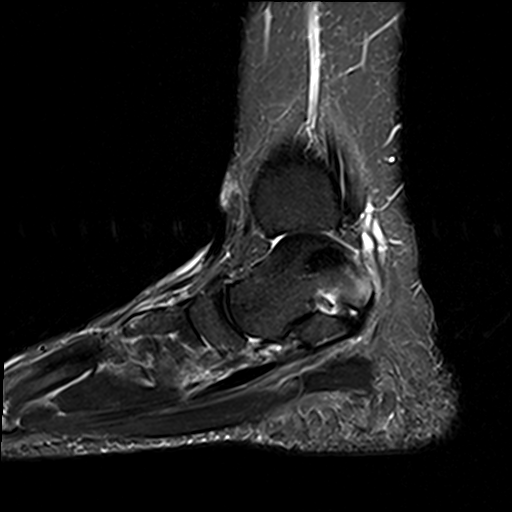
[im 14/24]
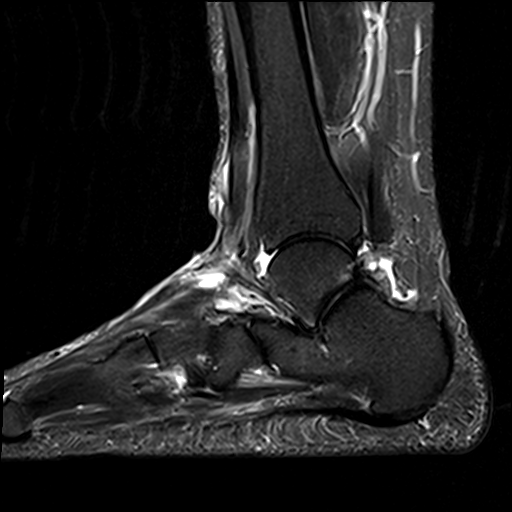
[im 19/24]
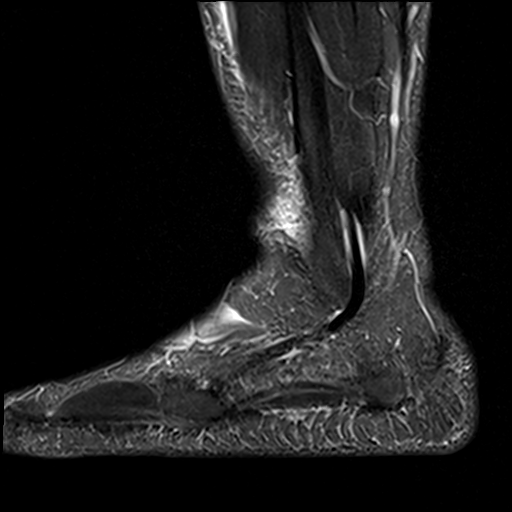
[im 24/24]
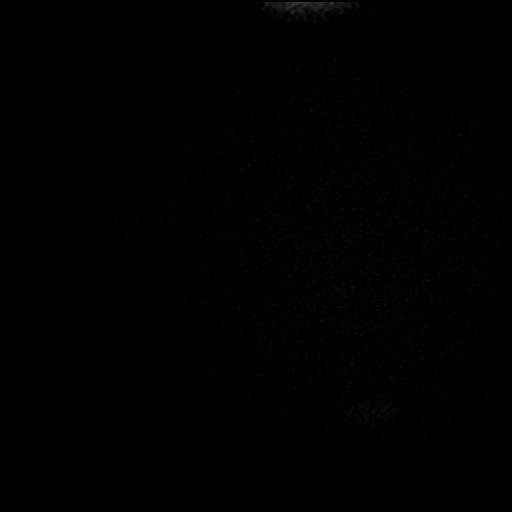

[40 of 40 positions shown; findings below may reference images not displayed]

FINDINGS: TENDONS

Peroneal: Peroneal longus tendon intact. Mild tendinosis of the
peroneus brevis just distal to the lateral malleolus with a small
amount of fluid in the tendon sheath.

Posteromedial: Posterior tibial tendon intact. Flexor hallucis
longus tendon intact. Flexor digitorum longus tendon intact. Small
amount of fluid in the posterior tibial tendon sheath.

Anterior: Tibialis anterior tendon intact. Extensor hallucis longus
tendon intact Extensor digitorum longus tendon intact.

Achilles:  Intact.

Plantar Fascia: Mild thickening of the medial band of the plantar
fascia at the calcaneal insertion most consistent with mild plantar
fasciitis.

LIGAMENTS

Lateral: Anterior talofibular ligament intact. Calcaneofibular
ligament intact. Posterior talofibular ligament intact. Anterior and
posterior tibiofibular ligaments intact.

Medial: Deltoid ligament intact. Spring ligament intact.

CARTILAGE

Ankle Joint: No joint effusion. Normal ankle mortise. No chondral
defect.

Subtalar Joints/Sinus Tarsi: Normal subtalar joints. No subtalar
joint effusion. Normal sinus tarsi.

Bones: No marrow signal abnormality. No fracture or dislocation.
Mild osteoarthritis of the second tarsometatarsal joint with
subchondral reactive marrow changes.

Soft Tissue: No fluid collection or hematoma.  Muscles are normal.
IMPRESSION: 1. Mild osteoarthritis of the second tarsometatarsal joint.
2. Mild tendinosis of the peroneus brevis with mild tenosynovitis.
3. Mild tenosynovitis of the posterior tibial tendon.
4. Mild plantar fasciitis of the medial band of the plantar fascia
at the calcaneal insertion without a tear.

## 2018-12-11 ENCOUNTER — Other Ambulatory Visit: Payer: Self-pay

## 2018-12-11 ENCOUNTER — Ambulatory Visit: Payer: BC Managed Care – PPO | Admitting: Podiatry

## 2018-12-11 ENCOUNTER — Encounter: Payer: Self-pay | Admitting: Podiatry

## 2018-12-11 DIAGNOSIS — M659 Synovitis and tenosynovitis, unspecified: Secondary | ICD-10-CM | POA: Diagnosis not present

## 2018-12-11 DIAGNOSIS — M25571 Pain in right ankle and joints of right foot: Secondary | ICD-10-CM

## 2018-12-11 MED ORDER — DICLOFENAC SODIUM 75 MG PO TBEC: 75.0000 mg | DELAYED_RELEASE_TABLET | Freq: Two times a day (BID) | ORAL | 1 refills | Status: DC

## 2018-12-13 NOTE — Progress Notes (Signed)
   HPI: 38 y.o. female presenting today for follow-up evaluation regarding right foot and ankle pain.  Patient states that the pain is the same as last visit.  She continues to have pain and tenderness throughout her foot and ankle especially during work.  No new complaints at this time.  Last visit we had an MRI ordered and she presents today to review the MRI results.  She presents today with her husband  Past Medical History:  Diagnosis Date  . Ankle pain 05/2016   RIGHT-PT SEEING DR FOWLER AND GETTING CORTISONE INJECTIONS  . Complication of anesthesia   . GERD (gastroesophageal reflux disease)    rare-NO MEDS  . Headache    MIGRAINES  . PONV (postoperative nausea and vomiting)      Physical Exam: General: The patient is alert and oriented x3 in no acute distress.  Dermatology: Skin is warm, dry and supple bilateral lower extremities. Negative for open lesions or macerations.  Vascular: Palpable pedal pulses bilaterally. No edema or erythema noted. Capillary refill within normal limits.  Neurological: Epicritic and protective threshold grossly intact bilaterally.   Musculoskeletal Exam: Range of motion within normal limits to all pedal and ankle joints bilateral. Muscle strength 5/5 in all groups bilateral.   MRI impression 12/04/2018: 1. 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 2. Mild tendinosis of the peroneus brevis with mild tenosynovitis. 3. Mild tenosynovitis of the posterior tibial tendon. 4. Mild plantar fasciitis of the medial band of the plantar fascia at the calcaneal insertion without a tear.  Assessment: 1.  Insertional Achilles tendinitis right -chronic 2.  Chronic foot pain right   Plan of Care:  1. Patient evaluated.  MRI reviewed.  2.  The patient has had orthotics in the past that are very hard and uncomfortable.  Today wound to set up an appointment with Liliane Channel, Pedorthist for new custom molded insoles 3.  I recommended the patient  continue conservative management.  At the moment I do not feel that anything surgically would benefit her. 4.  A lot of the patient's pain comes from the fact that she works at the [employer] center and is on her feet during her entire shift. 5.  Return to clinic as needed  *Works at [employer], DPM [facility]  Dr. Edrick Kins, DPM    [address], [place] [address]                Office [phone]  Fax [phone]

## 2018-12-28 ENCOUNTER — Ambulatory Visit: Payer: BC Managed Care – PPO | Admitting: Podiatry

## 2019-01-06 ENCOUNTER — Ambulatory Visit (INDEPENDENT_AMBULATORY_CARE_PROVIDER_SITE_OTHER): Payer: Self-pay | Admitting: Orthotics

## 2019-01-06 ENCOUNTER — Other Ambulatory Visit: Payer: Self-pay

## 2019-01-06 DIAGNOSIS — M722 Plantar fascial fibromatosis: Secondary | ICD-10-CM | POA: Diagnosis not present

## 2019-01-06 DIAGNOSIS — M25571 Pain in right ankle and joints of right foot: Secondary | ICD-10-CM

## 2019-01-06 DIAGNOSIS — M6788 Other specified disorders of synovium and tendon, other site: Secondary | ICD-10-CM

## 2019-01-06 NOTE — Progress Notes (Signed)
Patient came into today to be cast for Custom Foot Orthotics. Upon recommendation of Dr.  Patient presents with Goals are Plan vendor Patient came into today to be cast for Custom Foot Orthotics.  Upon recommendation of Dr. Ellard Artis Patient presents with sinus Tarsi, Right, PF right, OA 2 tarsometatarasal joint, PTTD Goals are logn arch support RF stability Plan vendor

## 2019-01-27 ENCOUNTER — Ambulatory Visit (INDEPENDENT_AMBULATORY_CARE_PROVIDER_SITE_OTHER): Payer: BC Managed Care – PPO | Admitting: Orthotics

## 2019-01-27 ENCOUNTER — Other Ambulatory Visit: Payer: Self-pay

## 2019-01-27 DIAGNOSIS — M25571 Pain in right ankle and joints of right foot: Secondary | ICD-10-CM

## 2019-01-27 DIAGNOSIS — M6788 Other specified disorders of synovium and tendon, other site: Secondary | ICD-10-CM

## 2019-01-27 DIAGNOSIS — M65971 Unspecified synovitis and tenosynovitis, right ankle and foot: Secondary | ICD-10-CM

## 2019-01-27 DIAGNOSIS — M659 Synovitis and tenosynovitis, unspecified: Secondary | ICD-10-CM

## 2019-01-27 NOTE — Progress Notes (Signed)
Patient came in today to pick up custom made foot orthotics.  The goals were accomplished and the patient reported no dissatisfaction with said orthotics.  Patient was advised of breakin period and how to report any issues. 

## 2019-04-06 ENCOUNTER — Other Ambulatory Visit: Payer: Self-pay | Admitting: Podiatry

## 2019-09-30 ENCOUNTER — Encounter: Payer: Self-pay | Admitting: Certified Nurse Midwife

## 2019-09-30 ENCOUNTER — Ambulatory Visit (INDEPENDENT_AMBULATORY_CARE_PROVIDER_SITE_OTHER): Payer: BC Managed Care – PPO | Admitting: Certified Nurse Midwife

## 2019-09-30 ENCOUNTER — Other Ambulatory Visit: Payer: Self-pay

## 2019-09-30 VITALS — BP 112/75 | HR 60 | Ht 65.0 in | Wt 229.9 lb

## 2019-09-30 DIAGNOSIS — Z7689 Persons encountering health services in other specified circumstances: Secondary | ICD-10-CM

## 2019-09-30 DIAGNOSIS — Z1231 Encounter for screening mammogram for malignant neoplasm of breast: Secondary | ICD-10-CM

## 2019-09-30 DIAGNOSIS — Z809 Family history of malignant neoplasm, unspecified: Secondary | ICD-10-CM

## 2019-09-30 DIAGNOSIS — Z6838 Body mass index (BMI) 38.0-38.9, adult: Secondary | ICD-10-CM | POA: Diagnosis not present

## 2019-09-30 DIAGNOSIS — Z8249 Family history of ischemic heart disease and other diseases of the circulatory system: Secondary | ICD-10-CM

## 2019-09-30 DIAGNOSIS — Z803 Family history of malignant neoplasm of breast: Secondary | ICD-10-CM

## 2019-09-30 NOTE — Patient Instructions (Signed)
Liraglutide injection (Weight Management) What is this medicine? LIRAGLUTIDE (LIR a GLOO tide) is used to help people lose weight and maintain weight loss. It is used with a reduced-calorie diet and exercise. This medicine may be used for other purposes; ask your health care provider or pharmacist if you have questions. COMMON BRAND NAME(S): Saxenda What should I tell my health care provider before I take this medicine? They need to know if you have any of these conditions:  endocrine tumors (MEN 2) or if someone in your family had these tumors  gallbladder disease  high cholesterol  history of alcohol abuse problem  history of pancreatitis  kidney disease or if you are on dialysis  liver disease  previous swelling of the tongue, face, or lips with difficulty breathing, difficulty swallowing, hoarseness, or tightening of the throat  stomach problems  suicidal thoughts, plans, or attempt; a previous suicide attempt by you or a family member  thyroid cancer or if someone in your family had thyroid cancer  an unusual or allergic reaction to liraglutide, other medicines, foods, dyes, or preservatives  pregnant or trying to get pregnant  breast-feeding How should I use this medicine? This medicine is for injection under the skin of your upper leg, stomach area, or upper arm. You will be taught how to prepare and give this medicine. Use exactly as directed. Take your medicine at regular intervals. Do not take it more often than directed. This drug comes with INSTRUCTIONS FOR USE. Ask your pharmacist for directions on how to use this drug. Read the information carefully. Talk to your pharmacist or health care provider if you have questions. It is important that you put your used needles and syringes in a special sharps container. Do not put them in a trash can. If you do not have a sharps container, call your pharmacist or healthcare provider to get one. A special MedGuide will be  given to you by the pharmacist with each prescription and refill. Be sure to read this information carefully each time. Talk to your pediatrician regarding the use of this medicine in children. Special care may be needed. Overdosage: If you think you have taken too much of this medicine contact a poison control center or emergency room at once. NOTE: This medicine is only for you. Do not share this medicine with others. What if I miss a dose? If you miss a dose, take it as soon as you can. If it is almost time for your next dose, take only that dose. Do not take double or extra doses. If you miss your dose for 3 days or more, call your doctor or health care professional to talk about how to restart this medicine. What may interact with this medicine?  insulin and other medicines for diabetes This list may not describe all possible interactions. Give your health care provider a list of all the medicines, herbs, non-prescription drugs, or dietary supplements you use. Also tell them if you smoke, drink alcohol, or use illegal drugs. Some items may interact with your medicine. What should I watch for while using this medicine? Visit your doctor or health care professional for regular checks on your progress. Drink plenty of fluids while taking this medicine. Check with your doctor or health care professional if you get an attack of severe diarrhea, nausea, and vomiting. The loss of too much body fluid can make it dangerous for you to take this medicine. This medicine may affect blood sugar levels. Ask your healthcare   provider if changes in diet or medicines are needed if you have diabetes. Patients and their families should watch out for worsening depression or thoughts of suicide. Also watch out for sudden changes in feelings such as feeling anxious, agitated, panicky, irritable, hostile, aggressive, impulsive, severely restless, overly excited and hyperactive, or not being able to sleep. If this happens,  especially at the beginning of treatment or after a change in dose, call your health care professional. Women should inform their health care provider if they wish to become pregnant or think they might be pregnant. Losing weight while pregnant is not advised and may cause harm to the unborn child. Talk to your health care provider for more information. What side effects may I notice from receiving this medicine? Side effects that you should report to your doctor or health care professional as soon as possible:  allergic reactions like skin rash, itching or hives, swelling of the face, lips, or tongue  breathing problems  diarrhea that continues or is severe  lump or swelling on the neck  severe nausea  signs and symptoms of infection like fever or chills; cough; sore throat; pain or trouble passing urine  signs and symptoms of low blood sugar such as feeling anxious; confusion; dizziness; increased hunger; unusually weak or tired; increased sweating; shakiness; cold, clammy skin; irritable; headache; blurred vision; fast heartbeat; loss of consciousness  signs and symptoms of kidney injury like trouble passing urine or change in the amount of urine  trouble swallowing  unusual stomach upset or pain  vomiting Side effects that usually do not require medical attention (report to your doctor or health care professional if they continue or are bothersome):  constipation  decreased appetite  diarrhea  fatigue  headache  nausea  pain, redness, or irritation at site where injected  stomach upset  stuffy or runny nose This list may not describe all possible side effects. Call your doctor for medical advice about side effects. You may report side effects to FDA at 1-800-FDA-1088. Where should I keep my medicine? Keep out of the reach of children. Store unopened pen in a refrigerator between 2 and 8 degrees C (36 and 46 degrees F). Do not freeze or use if the medicine has been  frozen. Protect from light and excessive heat. After you first use the pen, it can be stored at room temperature between 15 and 30 degrees C (59 and 86 degrees F) or in a refrigerator. Throw away your used pen after 30 days or after the expiration date, whichever comes first. Do not store your pen with the needle attached. If the needle is left on, medicine may leak from the pen. NOTE: This sheet is a summary. It may not cover all possible information. If you have questions about this medicine, talk to your doctor, pharmacist, or health care provider.  2020 Elsevier/Gold Standard (2018-11-12 21:16:59)  

## 2019-09-30 NOTE — Progress Notes (Signed)
Waist circumference: 44 in. BMI: 38.26

## 2019-10-03 DIAGNOSIS — Z809 Family history of malignant neoplasm, unspecified: Secondary | ICD-10-CM | POA: Insufficient documentation

## 2019-10-03 DIAGNOSIS — Z803 Family history of malignant neoplasm of breast: Secondary | ICD-10-CM | POA: Insufficient documentation

## 2019-10-03 DIAGNOSIS — Z8249 Family history of ischemic heart disease and other diseases of the circulatory system: Secondary | ICD-10-CM | POA: Insufficient documentation

## 2019-10-03 NOTE — Progress Notes (Signed)
GYN ENCOUNTER NOTE  Subjective:       Sue Cook is a 39 y.o. G89P0010 female here to start weight management program.   Previously used phentermine for weight loss in 2020 for approximately one (1) month; but did not like the side effects of the medication.   Found new motivation for weight loss in May 2021, exercising and "eating right" with spouse.   Denies difficulty breathing or respiratory distress, chest pain, abdominal pain, vaginal bleeding, dysuria, and leg pain or swelling.   Desires screening mammogram due to family history of breast cancer. Previously ordered during annual exam in 2020; however, patient never scheduled an appointment.    Gynecologic History  No LMP recorded. Patient has had a hysterectomy.  Contraception: status post hysterectomy.   Last Pap: up to date.   Last mammogram: ordered.   Obstetric History OB History  Gravida Para Term Preterm AB Living  4 3     1     SAB TAB Ectopic Multiple Live Births    1          # Outcome Date GA Lbr Len/2nd Weight Sex Delivery Anes PTL Lv  4 Para 2007    F Vag-Spont     3 TAB 2005          2 Para 2003    F Vag-Spont     1 Para 2002    M Vag-Spont       Past Medical History:  Diagnosis Date  . Ankle pain 05/2016   RIGHT-PT SEEING DR FOWLER AND GETTING CORTISONE INJECTIONS  . Complication of anesthesia   . GERD (gastroesophageal reflux disease)    rare-NO MEDS  . Headache    MIGRAINES  . PONV (postoperative nausea and vomiting)     Past Surgical History:  Procedure Laterality Date  . ABDOMINAL HYSTERECTOMY    . CHOLECYSTECTOMY N/A 06/11/2016   Procedure: LAPAROSCOPIC CHOLECYSTECTOMY WITH INTRAOPERATIVE CHOLANGIOGRAM;  Surgeon: 06/13/2016, MD;  Location: [facility] ORS;  Service: General;  Laterality: N/A;  . ENDOMETRIAL BIOPSY    . WISDOM TOOTH EXTRACTION      Current Outpatient Medications on File Prior to Visit  Medication Sig Dispense Refill  . cyclobenzaprine (FLEXERIL) 10 MG tablet  Take by mouth.    . diclofenac (VOLTAREN) 75 MG EC tablet TAKE 1 TABLET BY MOUTH TWICE A DAY 60 tablet 3  . SUMAtriptan (IMITREX) 100 MG tablet TAKE 1 TABLET BY MOUTH ONCE AS NEEDED FOR MIGRAINE. MAY TAKE SECOND DOSE AFTER 2 HRS IF NEEDED  0  . ibuprofen (ADVIL,MOTRIN) 200 MG tablet Take 600 mg by mouth every 6 (six) hours as needed for mild pain. (Patient not taking: Reported on 09/30/2019)    . meloxicam (MOBIC) 15 MG tablet TAKE 1 TABLET BY MOUTH EVERY DAY (Patient not taking: Reported on 09/30/2019)    . naproxen (NAPROSYN) 500 MG tablet Take 500 mg by mouth 2 (two) times daily with a meal. (Patient not taking: Reported on 09/30/2019)     No current facility-administered medications on file prior to visit.    No Known Allergies  Social History   Socioeconomic History  . Marital status: Married    Spouse name: Not on file  . Number of children: Not on file  . Years of education: Not on file  . Highest education level: Not on file  Occupational History  . Not on file  Tobacco Use  . Smoking status: Never Smoker  . Smokeless tobacco: Never Used  Vaping Use  . Vaping Use: Never used  Substance and Sexual Activity  . Alcohol use: No  . Drug use: No  . Sexual activity: Yes    Birth control/protection: Surgical  Other Topics Concern  . Not on file  Social History Narrative  . Not on file   Social Determinants of Health   Financial Resource Strain:   . Difficulty of Paying Living Expenses: Not on file  Food Insecurity:   . Worried About Programme researcher, broadcasting/film/video in the Last Year: Not on file  . Ran Out of Food in the Last Year: Not on file  Transportation Needs:   . Lack of Transportation (Medical): Not on file  . Lack of Transportation (Non-Medical): Not on file  Physical Activity:   . Days of Exercise per Week: Not on file  . Minutes of Exercise per Session: Not on file  Stress:   . Feeling of Stress : Not on file  Social Connections:   . Frequency of Communication with Friends  and Family: Not on file  . Frequency of Social Gatherings with Friends and Family: Not on file  . Attends Religious Services: Not on file  . Active Member of Clubs or Organizations: Not on file  . Attends Banker Meetings: Not on file  . Marital Status: Not on file  Intimate Partner Violence:   . Fear of Current or Ex-Partner: Not on file  . Emotionally Abused: Not on file  . Physically Abused: Not on file  . Sexually Abused: Not on file    Family History  Problem Relation Age of Onset  . Heart attack Paternal Grandfather   . Breast cancer Maternal Grandmother   . Heart attack Maternal Grandmother   . Heart attack Maternal Grandfather   . Heart attack Father   . Cancer Mother   . Diabetes Sister     The following portions of the patient's history were reviewed and updated as appropriate: allergies, current medications, past family history, past medical history, past social history, past surgical history and problem list.  Review of Systems  ROS negative except as noted above. Information obtained from patient.   Objective:   BP 112/75   Pulse 60   Ht 5\' 5"  (1.651 m)   Wt 229 lb 14.4 oz (104.3 kg)   BMI 38.26 kg/m    CONSTITUTIONAL: Well-developed, well-nourished female in no acute distress.   ABDOMEN: Waist circumference 44 inches  MUSCULOSKELETAL: Normal range of motion. No tenderness.  No cyanosis, clubbing, or edema.  Assessment:   1. Encounter for screening mammogram for breast cancer - MM 3D SCREEN BREAST BILATERAL; Future  2. Family history of cancer - MM 3D SCREEN BREAST BILATERAL; Future  3. Family history of breast cancer - MM 3D SCREEN BREAST BILATERAL; Future  4. BMI 38.0-38.9,adult - Amb ref to Medical Nutrition Therapy-MNT  5. Family history of heart attack - Amb ref to Medical Nutrition Therapy-MNT  6. Encounter for weight management - Amb ref to Medical Nutrition Therapy-MNT     Plan:   Medical weight loss options  discussed, handout provided.   Referral to MNT, see orders.   Mammogram ordered, see chart.   Reviewed red flag symptoms and when to call.   Patient to contact CNM if weight loss medication covered by insurance.   RTC as needed.    02-03-1976, CNM [facility]   A total of 20 minutes were spent face-to-face with the patient during this encounter  and over half of that time dealt with counseling and coordination of care.

## 2019-11-11 ENCOUNTER — Ambulatory Visit
Admission: RE | Admit: 2019-11-11 | Discharge: 2019-11-11 | Disposition: A | Discharge status: Home / Self Care | Payer: BC Managed Care – PPO | Source: Ambulatory Visit | Attending: Certified Nurse Midwife | Admitting: Certified Nurse Midwife

## 2019-11-11 ENCOUNTER — Other Ambulatory Visit: Payer: Self-pay

## 2019-11-11 DIAGNOSIS — Z1231 Encounter for screening mammogram for malignant neoplasm of breast: Secondary | ICD-10-CM | POA: Diagnosis present

## 2019-11-11 DIAGNOSIS — Z803 Family history of malignant neoplasm of breast: Secondary | ICD-10-CM

## 2019-11-11 DIAGNOSIS — Z809 Family history of malignant neoplasm, unspecified: Secondary | ICD-10-CM

## 2019-11-11 IMAGING — MG DIGITAL SCREENING BILAT W/ TOMO W/ CAD
6 of 12 series · 6 of 36 positions shown · non-contrast
Comparison: Previous exam(s).

CLINICAL DATA: Screening.

EXAM:
DIGITAL SCREENING BILATERAL MAMMOGRAM WITH TOMO AND CAD

[L CC synth-2D]
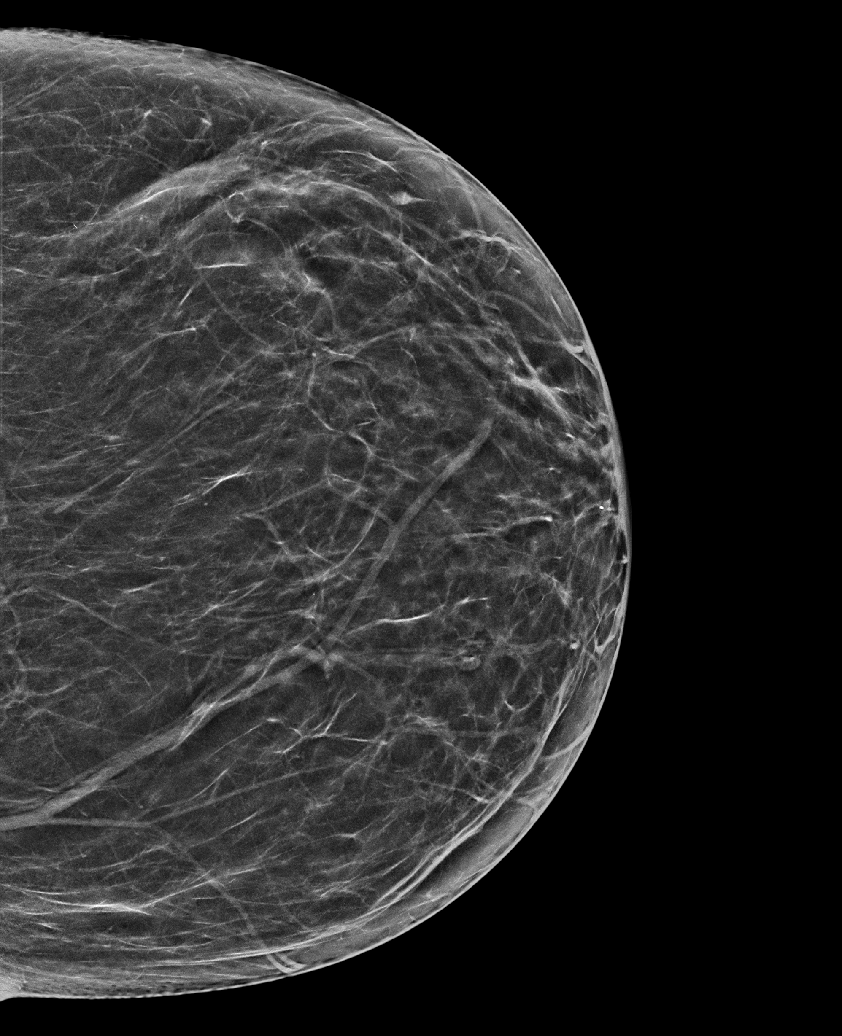

[L MLO synth-2D (1 of 2)]
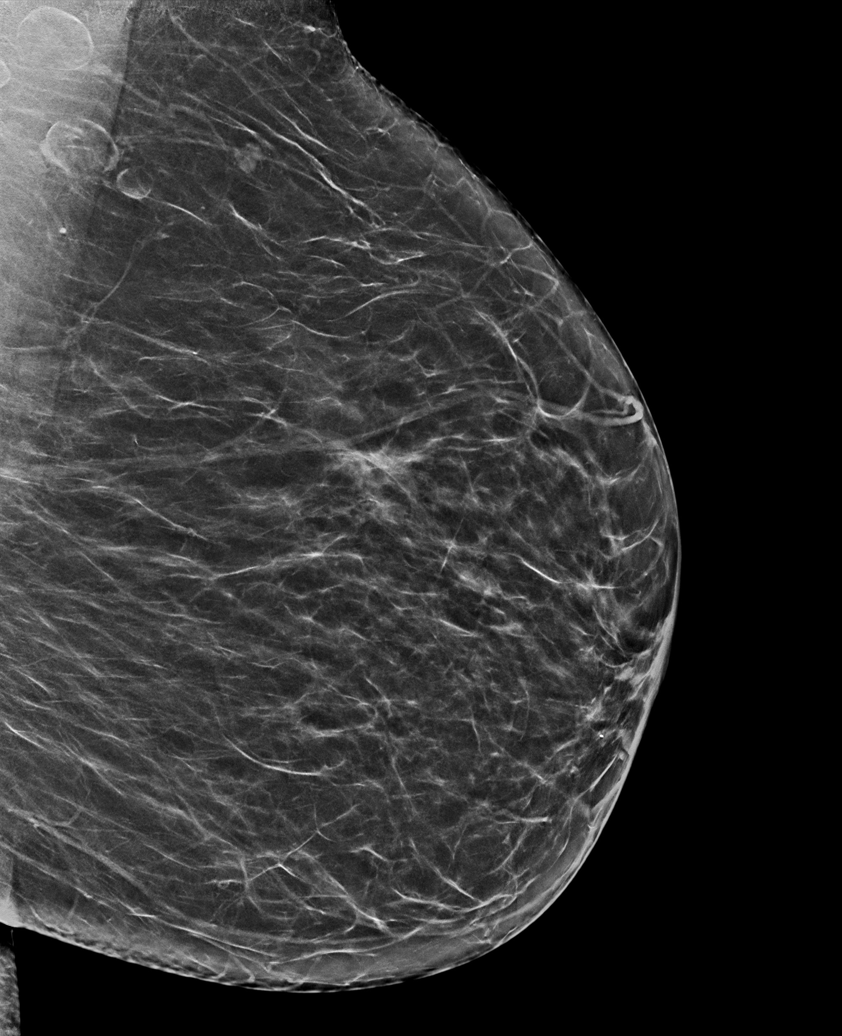

[L MLO synth-2D (2 of 2)]
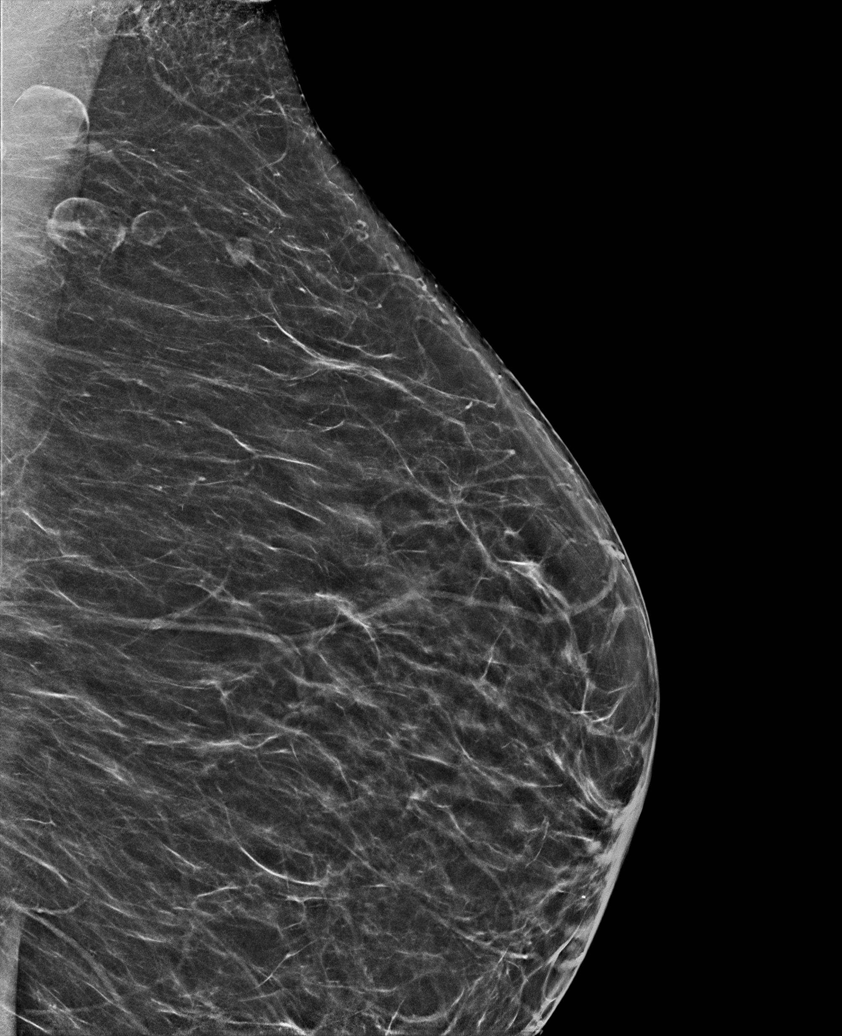

[R MLO synth-2D (1 of 2)]
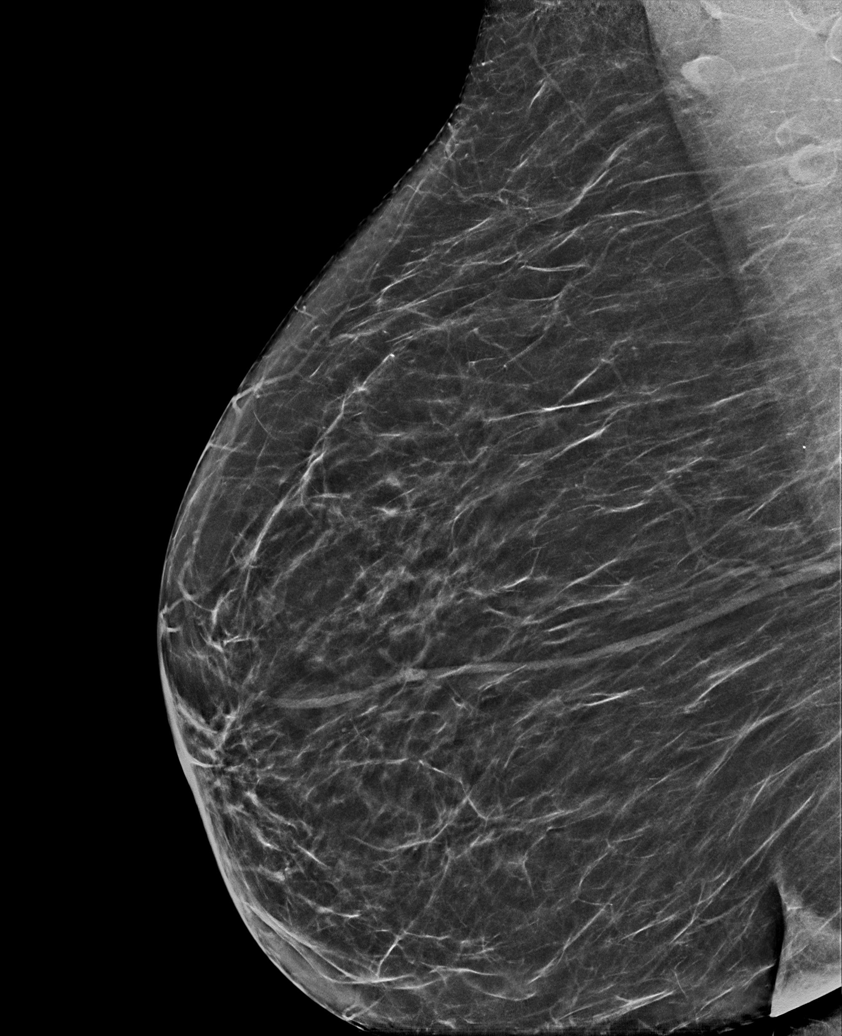

[R CC synth-2D]
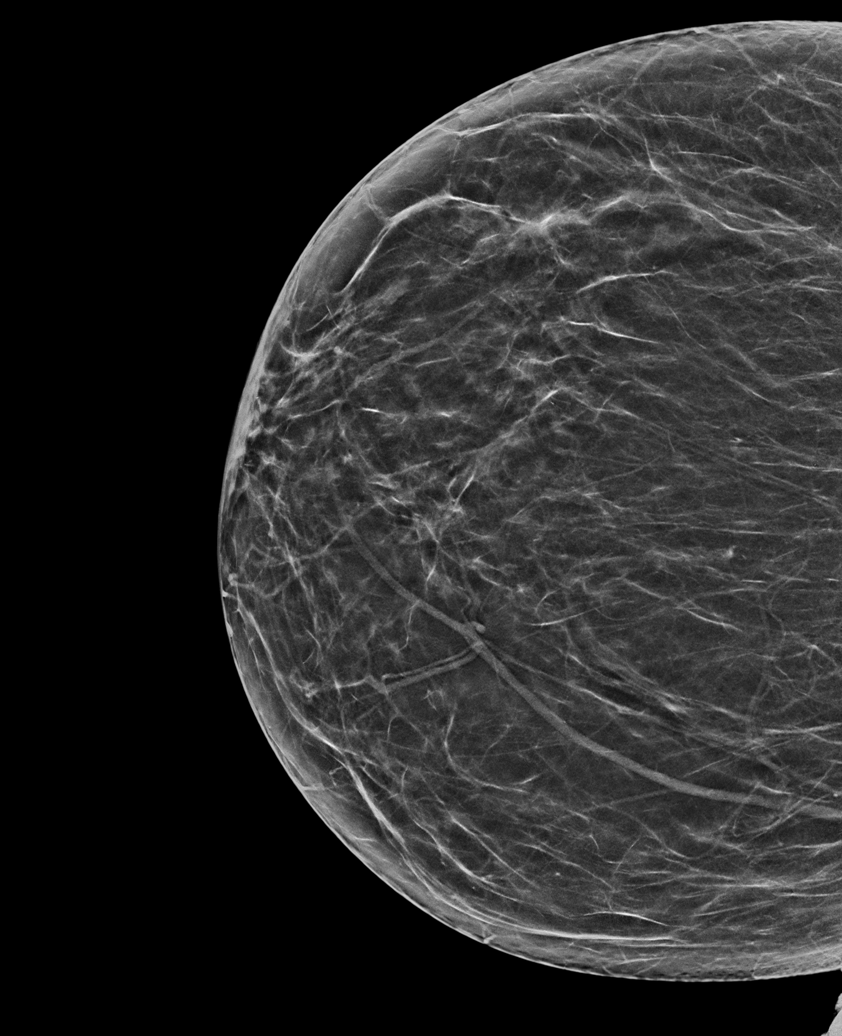

[R MLO synth-2D (2 of 2)]
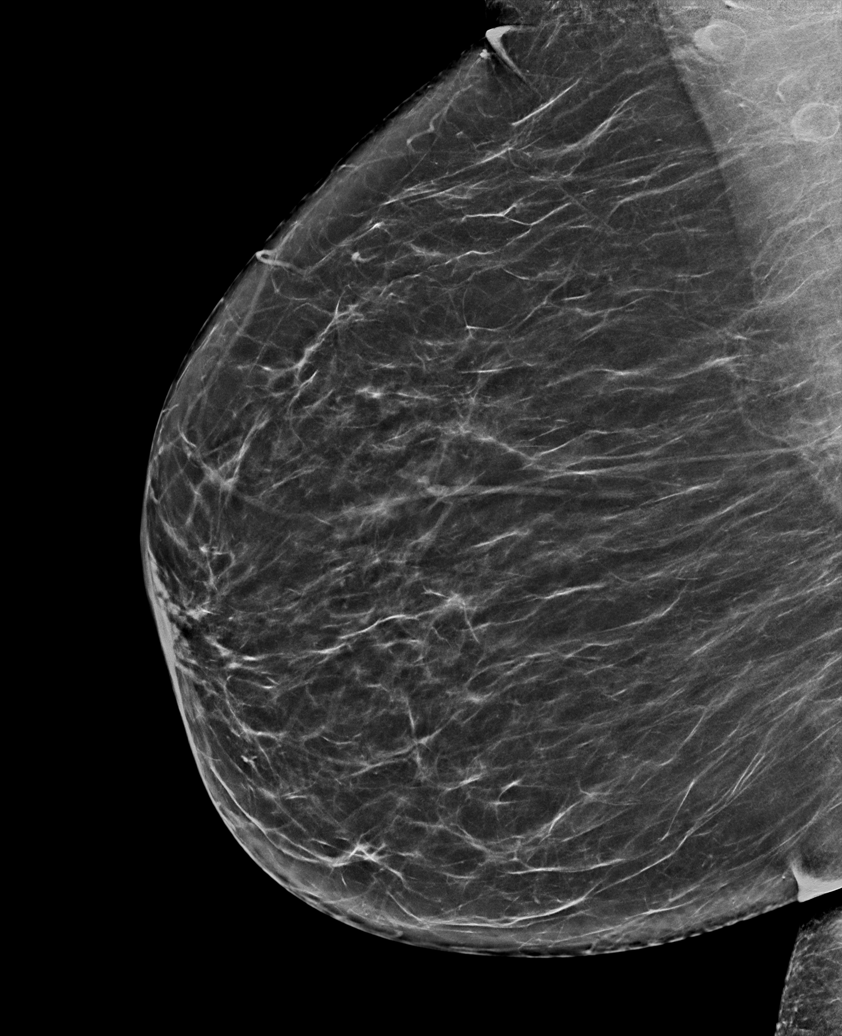

[6 of 36 positions shown; findings below may reference images not displayed]

ACR Breast Density Category b: There are scattered areas of
fibroglandular density.
FINDINGS: There are no findings suspicious for malignancy. Images were
processed with CAD.
IMPRESSION: No mammographic evidence of malignancy. A result letter of this
screening mammogram will be mailed directly to the patient.

RECOMMENDATION:
Screening mammogram in one year. (Code:[TQ])

BI-RADS CATEGORY  1: Negative.

## 2019-12-06 ENCOUNTER — Encounter: Payer: BLUE CROSS/BLUE SHIELD | Admitting: Certified Nurse Midwife

## 2020-01-17 ENCOUNTER — Other Ambulatory Visit: Payer: Self-pay

## 2020-01-17 ENCOUNTER — Ambulatory Visit (INDEPENDENT_AMBULATORY_CARE_PROVIDER_SITE_OTHER): Payer: BC Managed Care – PPO | Admitting: Certified Nurse Midwife

## 2020-01-17 ENCOUNTER — Encounter: Payer: Self-pay | Admitting: Certified Nurse Midwife

## 2020-01-17 VITALS — BP 117/84 | HR 63 | Ht 65.0 in | Wt 219.6 lb

## 2020-01-17 DIAGNOSIS — N814 Uterovaginal prolapse, unspecified: Secondary | ICD-10-CM

## 2020-01-17 DIAGNOSIS — Z1231 Encounter for screening mammogram for malignant neoplasm of breast: Secondary | ICD-10-CM

## 2020-01-17 DIAGNOSIS — Z01419 Encounter for gynecological examination (general) (routine) without abnormal findings: Secondary | ICD-10-CM | POA: Diagnosis not present

## 2020-01-17 DIAGNOSIS — Z13220 Encounter for screening for lipoid disorders: Secondary | ICD-10-CM | POA: Diagnosis not present

## 2020-01-17 DIAGNOSIS — E7800 Pure hypercholesterolemia, unspecified: Secondary | ICD-10-CM

## 2020-01-17 DIAGNOSIS — N812 Incomplete uterovaginal prolapse: Secondary | ICD-10-CM

## 2020-01-17 NOTE — Patient Instructions (Addendum)
Pelvic Organ Prolapse Pelvic organ prolapse is the stretching, bulging, or dropping of pelvic organs into an abnormal position. It happens when the muscles and tissues that surround and support pelvic structures become weak or stretched. Pelvic organ prolapse can involve the:  Vagina (vaginal prolapse).  Uterus (uterine prolapse).  Bladder (cystocele).  Rectum (rectocele).  Intestines (enterocele). When organs other than the vagina are involved, they often bulge into the vagina or protrude from the vagina, depending on how severe the prolapse is. What are the causes? This condition may be caused by:  Pregnancy, labor, and childbirth.  Past pelvic surgery.  Decreased production of the hormone estrogen associated with menopause.  Consistently lifting more than 50 lb (23 kg).  Obesity.  Long-term inability to pass stool (chronic constipation).  A cough that lasts a long time (chronic).  Buildup of fluid in the abdomen due to certain diseases and other conditions. What are the signs or symptoms? Symptoms of this condition include:  Passing a little urine (loss of bladder control) when you cough, sneeze, strain, and exercise (stress incontinence). This may be worse immediately after childbirth. It may gradually improve over time.  Feeling pressure in your pelvis or vagina. This pressure may increase when you cough or when you are passing stool.  A bulge that protrudes from the opening of your vagina.  Difficulty passing urine or stool.  Pain in your lower back.  Pain, discomfort, or disinterest in sex.  Repeated bladder infections (urinary tract infections).  Difficulty inserting a tampon. In some people, this condition causes no symptoms. How is this diagnosed? This condition may be diagnosed based on a vaginal and rectal exam. During the exam, you may be asked to cough and strain while you are lying down, sitting, and standing up. Your health care provider will  determine if other tests are required, such as bladder function tests. How is this treated? Treatment for this condition may depend on your symptoms. Treatment may include:  Lifestyle changes, such as changes to your diet.  Emptying your bladder at scheduled times (bladder training therapy). This can help reduce or avoid urinary incontinence.  Estrogen. Estrogen may help mild prolapse by increasing the strength and tone of pelvic floor muscles.  Kegel exercises. These may help mild cases of prolapse by strengthening and tightening the muscles of the pelvic floor.  A soft, flexible device that helps support the vaginal walls and keep pelvic organs in place (pessary). This is inserted into your vagina by your health care provider.  Surgery. This is often the only form of treatment for severe prolapse. Follow these instructions at home:  Avoid drinking beverages that contain caffeine or alcohol.  Increase your intake of high-fiber foods. This can help decrease constipation and straining during bowel movements.  Lose weight if recommended by your health care provider.  Wear a sanitary pad or adult diapers if you have urinary incontinence.  Avoid heavy lifting and straining with exercise and work. Do not hold your breath when you perform mild to moderate lifting and exercise activities. Limit your activities as directed by your health care provider.  Do Kegel exercises as directed by your health care provider. To do this: ? Squeeze your pelvic floor muscles tight. You should feel a tight lift in your rectal area and a tightness in your vaginal area. Keep your stomach, buttocks, and legs relaxed. ? Hold the muscles tight for up to 10 seconds. ? Relax your muscles. ? Repeat this exercise 50 times a day,  or as many times as told by your health care provider. Continue to do this exercise for at least 4-6 weeks, or for as long as told by your health care provider.  Take over-the-counter and  prescription medicines only as told by your health care provider.  If you have a pessary, take care of it as told by your health care provider.  Keep all follow-up visits as told by your health care provider. This is important. Contact a health care provider if you:  Have symptoms that interfere with your daily activities or sex life.  Need medicine to help with the discomfort.  Notice bleeding from your vagina that is not related to your period.  Have a fever.  Have pain or bleeding when you urinate.  Have bleeding when you pass stool.  Pass urine when you have sex.  Have chronic constipation.  Have a pessary that falls out.  Have bad smelling vaginal discharge.  Have an unusual, low pain in your abdomen. Summary  Pelvic organ prolapse is the stretching, bulging, or dropping of pelvic organs into an abnormal position. It happens when the muscles and tissues that surround and support pelvic structures become weak or stretched.  When organs other than the vagina are involved, they often bulge into the vagina or protrude from the vagina, depending on how severe the prolapse is.  In most cases, this condition needs to be treated only if it produces symptoms. Treatment may include lifestyle changes, estrogen, Kegel exercises, pessary insertion, or surgery.  Avoid heavy lifting and straining with exercise and work. Do not hold your breath when you perform mild to moderate lifting and exercise activities. Limit your activities as directed by your health care provider. This information is not intended to replace advice given to you by your health care provider. Make sure you discuss any questions you have with your health care provider. Document Revised: 01/29/2017 Document Reviewed: 01/29/2017 Elsevier Patient Education  2020 [employer] Inc.    Preventive Care 74-13 Years Old, Female Preventive care refers to visits with your health care provider and lifestyle choices that can  promote health and wellness. This includes:  A yearly physical exam. This may also be called an annual well check.  Regular dental visits and eye exams.  Immunizations.  Screening for certain conditions.  Healthy lifestyle choices, such as eating a healthy diet, getting regular exercise, not using drugs or products that contain nicotine and tobacco, and limiting alcohol use. What can I expect for my preventive care visit? Physical exam Your health care provider will check your:  Height and weight. This may be used to calculate body mass index (BMI), which tells if you are at a healthy weight.  Heart rate and blood pressure.  Skin for abnormal spots. Counseling Your health care provider may ask you questions about your:  Alcohol, tobacco, and drug use.  Emotional well-being.  Home and relationship well-being.  Sexual activity.  Eating habits.  Work and work Statistician.  Method of birth control.  Menstrual cycle.  Pregnancy history. What immunizations do I need?  Influenza (flu) vaccine  This is recommended every year. Tetanus, diphtheria, and pertussis (Tdap) vaccine  You may need a Td booster every 10 years. Varicella (chickenpox) vaccine  You may need this if you have not been vaccinated. Zoster (shingles) vaccine  You may need this after age 71. Measles, mumps, and rubella (MMR) vaccine  You may need at least one dose of MMR if you were born in 1957  or later. You may also need a second dose. Pneumococcal conjugate (PCV13) vaccine  You may need this if you have certain conditions and were not previously vaccinated. Pneumococcal polysaccharide (PPSV23) vaccine  You may need one or two doses if you smoke cigarettes or if you have certain conditions. Meningococcal conjugate (MenACWY) vaccine  You may need this if you have certain conditions. Hepatitis A vaccine  You may need this if you have certain conditions or if you travel or work in places where  you may be exposed to hepatitis A. Hepatitis B vaccine  You may need this if you have certain conditions or if you travel or work in places where you may be exposed to hepatitis B. Haemophilus influenzae type b (Hib) vaccine  You may need this if you have certain conditions. Human papillomavirus (HPV) vaccine  If recommended by your health care provider, you may need three doses over 6 months. You may receive vaccines as individual doses or as more than one vaccine together in one shot (combination vaccines). Talk with your health care provider about the risks and benefits of combination vaccines. What tests do I need? Blood tests  Lipid and cholesterol levels. These may be checked every 5 years, or more frequently if you are over 42 years old.  Hepatitis C test.  Hepatitis B test. Screening  Lung cancer screening. You may have this screening every year starting at age 56 if you have a 30-pack-year history of smoking and currently smoke or have quit within the past 15 years.  Colorectal cancer screening. All adults should have this screening starting at age 106 and continuing until age 24. Your health care provider may recommend screening at age 90 if you are at increased risk. You will have tests every 1-10 years, depending on your results and the type of screening test.  Diabetes screening. This is done by checking your blood sugar (glucose) after you have not eaten for a while (fasting). You may have this done every 1-3 years.  Mammogram. This may be done every 1-2 years. Talk with your health care provider about when you should start having regular mammograms. This may depend on whether you have a family history of breast cancer.  BRCA-related cancer screening. This may be done if you have a family history of breast, ovarian, tubal, or peritoneal cancers.  Pelvic exam and Pap test. This may be done every 3 years starting at age 49. Starting at age 93, this may be done every 5 years  if you have a Pap test in combination with an HPV test. Other tests  Sexually transmitted disease (STD) testing.  Bone density scan. This is done to screen for osteoporosis. You may have this scan if you are at high risk for osteoporosis. Follow these instructions at home: Eating and drinking  Eat a diet that includes fresh fruits and vegetables, whole grains, lean protein, and low-fat dairy.  Take vitamin and mineral supplements as recommended by your health care provider.  Do not drink alcohol if: ? Your health care provider tells you not to drink. ? You are pregnant, may be pregnant, or are planning to become pregnant.  If you drink alcohol: ? Limit how much you have to 0-1 drink a day. ? Be aware of how much alcohol is in your drink. In the [place] one drink equals one 12 oz bottle of beer (355 mL), one 5 oz glass of wine (148 mL), or one 1 oz glass of hard liquor (44  mL). Lifestyle  Take daily care of your teeth and gums.  Stay active. Exercise for at least 30 minutes on 5 or more days each week.  Do not use any products that contain nicotine or tobacco, such as cigarettes, e-cigarettes, and chewing tobacco. If you need help quitting, ask your health care provider.  If you are sexually active, practice safe sex. Use a condom or other form of birth control (contraception) in order to prevent pregnancy and STIs (sexually transmitted infections).  If told by your health care provider, take low-dose aspirin daily starting at age 53. What's next?  Visit your health care provider once a year for a well check visit.  Ask your health care provider how often you should have your eyes and teeth checked.  Stay up to date on all vaccines. This information is not intended to replace advice given to you by your health care provider. Make sure you discuss any questions you have with your health care provider. Document Revised: 09/18/2017 Document Reviewed: 09/18/2017 Elsevier Patient  Education  2020 [employer].

## 2020-01-17 NOTE — Progress Notes (Signed)
I have seen, interviewed, and examined the patient in conjunction with the [employer] [facility] [employer] and affirm the diagnosis and management plan.   Gunnar Bulla, CNM [facility], [facility] 01/17/20 12:16 PM

## 2020-01-17 NOTE — Progress Notes (Signed)
Pt present for annual exam.  

## 2020-01-17 NOTE — Progress Notes (Signed)
ANNUAL PREVENTATIVE CARE GYN  ENCOUNTER NOTE  Subjective:       Sue Cook is a 39 y.o. G63P0010 female here for a routine annual gynecologic exam.  Current complaints: 1. "Feels like something is falling out of her vagina"; worse with squatting and movement. Reports husband is able to feel during intercourse.   Patient last seen in office on 09/30/2019 to discussed weight management options, has yet to follow up at this time.   Denies shortness of breath or respiratory difficulty, chest pain, abdominal pain, vaginal bleeding, or leg swelling and pain.  Gynecologic History  No LMP recorded. Patient has had a hysterectomy.  Contraception: status post hysterectomy  Last Pap:02/11/18 Results were: Neg/Neg  Last mammogram: 10/2019. Results were:BI-RADS-1  Obstetric History  OB History  Gravida Para Term Preterm AB Living  4 3     1     SAB IAB Ectopic Multiple Live Births    1          # Outcome Date GA Lbr Len/2nd Weight Sex Delivery Anes PTL Lv  4 Para 2007    F Vag-Spont     3 IAB 2005          2 Para 2003    F Vag-Spont     1 Para 2002    M Vag-Spont       Past Medical History:  Diagnosis Date   Ankle pain 05/2016   RIGHT-PT SEEING DR 06/2016 AND GETTING CORTISONE INJECTIONS   Complication of anesthesia    GERD (gastroesophageal reflux disease)    rare-NO MEDS   Headache    MIGRAINES   PONV (postoperative nausea and vomiting)     Past Surgical History:  Procedure Laterality Date   ABDOMINAL HYSTERECTOMY     CHOLECYSTECTOMY N/A 06/11/2016   Procedure: LAPAROSCOPIC CHOLECYSTECTOMY WITH INTRAOPERATIVE CHOLANGIOGRAM;  Surgeon: 06/13/2016, MD;  Location: [facility] ORS;  Service: General;  Laterality: N/A;   ENDOMETRIAL BIOPSY     WISDOM TOOTH EXTRACTION      Current Outpatient Medications on File Prior to Visit  Medication Sig Dispense Refill   cyclobenzaprine (FLEXERIL) 10 MG tablet Take by mouth.     diclofenac (VOLTAREN) 75 MG EC tablet TAKE 1  TABLET BY MOUTH TWICE A DAY (Patient taking differently: as needed.) 60 tablet 3   ibuprofen (ADVIL,MOTRIN) 200 MG tablet Take 600 mg by mouth every 6 (six) hours as needed for mild pain.     naproxen (NAPROSYN) 500 MG tablet Take 500 mg by mouth 2 (two) times daily with a meal.     SUMAtriptan (IMITREX) 100 MG tablet TAKE 1 TABLET BY MOUTH ONCE AS NEEDED FOR MIGRAINE. MAY TAKE SECOND DOSE AFTER 2 HRS IF NEEDED  0   meloxicam (MOBIC) 15 MG tablet TAKE 1 TABLET BY MOUTH EVERY DAY (Patient not taking: No sig reported)     No current facility-administered medications on file prior to visit.    No Known Allergies  Social History   Socioeconomic History   Marital status: Married    Spouse name: Not on file   Number of children: Not on file   Years of education: Not on file   Highest education level: Not on file  Occupational History   Not on file  Tobacco Use   Smoking status: Never Smoker   Smokeless tobacco: Never Used  Vaping Use   Vaping Use: Never used  Substance and Sexual Activity   Alcohol use: Yes    Comment: occas  Drug use: No   Sexual activity: Yes    Birth control/protection: Surgical  Other Topics Concern   Not on file  Social History Narrative   Not on file   Social Determinants of Health   Financial Resource Strain: Not on file  Food Insecurity: Not on file  Transportation Needs: Not on file  Physical Activity: Not on file  Stress: Not on file  Social Connections: Not on file  Intimate Partner Violence: Not on file    Family History  Problem Relation Age of Onset   Heart attack Paternal Grandfather    Breast cancer Maternal Grandmother    Heart attack Maternal Grandmother    Heart attack Maternal Grandfather    Heart attack Father    Cancer Mother    Diabetes Sister    Breast cancer Maternal Aunt        mat great aunt    The following portions of the patient's history were reviewed and updated as appropriate: allergies,  current medications, past family history, past medical history, past social history, past surgical history and problem list.  Review of Systems  ROS - Negative other than noted above. Information obtained from patient  Objective:   BP 117/84    Pulse 63    Ht 5\' 5"  (1.651 m)    Wt 99.6 kg    BMI 36.54 kg/m    CONSTITUTIONAL: Well-developed, well-nourished female in no acute distress.   PSYCHIATRIC: Normal mood and affect. Normal behavior. Normal judgment and thought content.  NEUROLGIC: Alert and oriented to person, place, and time. Normal muscle tone coordination. No cranial nerve deficit noted.  EYES: Conjunctivae and EOM are normal. .  NECK: Normal range of motion, supple, no masses.  Normal thyroid.   SKIN: Skin is warm and dry. No rash noted. Not diaphoretic. No erythema. No pallor.  CARDIOVASCULAR: Normal heart rate noted, regular rhythm, no murmur.  RESPIRATORY: Clear to auscultation bilaterally. Effort and breath sounds normal, no problems with respiration noted.  BREASTS: Symmetric in size. No masses, skin changes, nipple drainage, or lymphadenopathy.  ABDOMEN: Soft, normal bowel sounds, no distention noted.  No tenderness, rebound or guarding.   BLADDER: Normal  PELVIC:   External Genitalia: Normal  Vagina: Normal  Cervix: Prolapsed   Uterus: Surgically absent  Adnexa: Surgically absent   MUSCULOSKELETAL: Normal range of motion. No tenderness.  No cyanosis, clubbing, or edema.  2+ distal pulses.  Assessment:   Annual gynecologic examination 39 y.o.   Contraception: status post hysterectomy   Obesity 2   Problem List Items Addressed This Visit   None   Visit Diagnoses    Encounter for well woman exam    -  Primary   Relevant Orders   Ambulatory referral to Physical Therapy   Lipid panel   MM 3D SCREEN BREAST BILATERAL   Cervical prolapse       Relevant Orders   Ambulatory referral to Physical Therapy   Encounter for screening mammogram for breast  cancer       Relevant Orders   MM 3D SCREEN BREAST BILATERAL   Screening cholesterol level       Relevant Orders   Lipid panel   Elevated LDL cholesterol level       Relevant Orders   Lipid panel      Plan:   Pap: Not needed  Mammogram: Ordered  Labs: Lipid 1, patient to return to do fasting labs.  Routine preventative health maintenance measures emphasized: Exercise/Diet/Weight control, Tobacco Warnings,  Alcohol/Substance use risks, Stress Management, Peer Pressure Issues and Safe Sex  Pelvic floor therapy referral placed, see orders  RTC x 1 year for ANNUAL woman's wellness exam with JML or sooner if needed.  Juliann Pares, Student-MidWife  [employer] 01/17/20 12:00 PM

## 2020-01-25 ENCOUNTER — Other Ambulatory Visit: Payer: BC Managed Care – PPO

## 2020-01-31 ENCOUNTER — Other Ambulatory Visit: Payer: BC Managed Care – PPO

## 2020-01-31 ENCOUNTER — Other Ambulatory Visit: Payer: Self-pay

## 2020-02-01 LAB — LIPID PANEL
Chol/HDL Ratio: 4.2 ratio (ref 0.0–4.4)
Cholesterol, Total: 196 mg/dL (ref 100–199)
HDL: 47 mg/dL (ref >39)
LDL Chol Calc (NIH): 131 mg/dL — ABNORMAL HIGH (ref 0–99)
Triglycerides: 101 mg/dL (ref 0–149)
VLDL Cholesterol Cal: 18 mg/dL (ref 5–40)

## 2020-08-25 ENCOUNTER — Other Ambulatory Visit: Payer: Self-pay

## 2020-08-25 ENCOUNTER — Ambulatory Visit: Payer: BC Managed Care – PPO | Admitting: Podiatry

## 2020-08-25 ENCOUNTER — Ambulatory Visit (INDEPENDENT_AMBULATORY_CARE_PROVIDER_SITE_OTHER): Payer: BC Managed Care – PPO

## 2020-08-25 DIAGNOSIS — R252 Cramp and spasm: Secondary | ICD-10-CM

## 2020-08-25 DIAGNOSIS — M659 Synovitis and tenosynovitis, unspecified: Secondary | ICD-10-CM

## 2020-08-25 DIAGNOSIS — Q667 Congenital pes cavus, unspecified foot: Secondary | ICD-10-CM

## 2020-08-30 NOTE — Progress Notes (Signed)
Subjective:  Patient ID: Sue Cook, female    DOB: [birth year],  MRN: [id number]  Chief Complaint  Patient presents with   Foot Pain    Right foot pain    40 y.o. female presents with the above complaint.  Patient presents with complaint of weakness to the right peroneal tendon group with K low adductovarus foot structure.  Patient has increasingly noticed higher arch on the right side than on the left side.  Patient states that she states that it flares up when she is walking and she is also had plenty of swelling in the ankle.  She was seen by Dr. Logan Bores long time ago.  She states that she has orthotics which seems to help much.  She states she had an MRI done in the past to which did not show anything.  She wanted to discuss other treatment options.  She states that she has continuous pain in the ankle because of her foot structure.  She denies any other acute complaints she has not seen anyone else prior to seeing me for this particular deformity.   Review of Systems: Negative except as noted in the HPI. Denies N/V/F/Ch.  Past Medical History:  Diagnosis Date   Ankle pain 05/2016   RIGHT-PT SEEING DR Ether Griffins AND GETTING CORTISONE INJECTIONS   Complication of anesthesia    GERD (gastroesophageal reflux disease)    rare-NO MEDS   Headache    MIGRAINES   PONV (postoperative nausea and vomiting)     Current Outpatient Medications:    cyclobenzaprine (FLEXERIL) 10 MG tablet, Take by mouth., Disp: , Rfl:    diclofenac (VOLTAREN) 75 MG EC tablet, TAKE 1 TABLET BY MOUTH TWICE A DAY (Patient taking differently: as needed.), Disp: 60 tablet, Rfl: 3   ibuprofen (ADVIL,MOTRIN) 200 MG tablet, Take 600 mg by mouth every 6 (six) hours as needed for mild pain., Disp: , Rfl:    meloxicam (MOBIC) 15 MG tablet, TAKE 1 TABLET BY MOUTH EVERY DAY (Patient not taking: No sig reported), Disp: , Rfl:    naproxen (NAPROSYN) 500 MG tablet, Take 500 mg by mouth 2 (two) times daily with a meal., Disp: ,  Rfl:    SUMAtriptan (IMITREX) 100 MG tablet, TAKE 1 TABLET BY MOUTH ONCE AS NEEDED FOR MIGRAINE. MAY TAKE SECOND DOSE AFTER 2 HRS IF NEEDED, Disp: , Rfl: 0  Social History   Tobacco Use  Smoking Status Never  Smokeless Tobacco Never    No Known Allergies Objective:  There were no vitals filed for this visit. There is no height or weight on file to calculate BMI. Constitutional Well developed. Well nourished.  Vascular Dorsalis pedis pulses palpable bilaterally. Posterior tibial pulses palpable bilaterally. Capillary refill normal to all digits.  No cyanosis or clubbing noted. Pedal hair growth normal.  Neurologic Normal speech. Oriented to person, place, and time. Epicritic sensation to light touch grossly present bilaterally.  Dermatologic Nails well groomed and normal in appearance. No open wounds. No skin lesions.  Orthopedic: Pain on palpation along the course of the peroneal tendon extending into the muscle group.  Decreased muscle strength noted of the peroneal tendons.  Increased muscle strength noted of the posterior tibial tendon.  Foot structure appears to be high arch foot structure with cable adductovarus foot structure.   Radiographs: 3 views of skeletally mature the right foot: There is decreasing calcaneal inclination angle increasing talar declination angleOsteoarthritic changes noted at the subtalar joint.  No signs of calcaneonavicular coalition noted.:  No bony abnormalities identified. Assessment:   1. Foot spasms   2. Pes cavus    Plan:  Patient was evaluated and treated and all questions answered.  Right peroneal tendon weakness with an MRI flexible cavus foot structure -Clinically I discussed with the patient the etiology of peroneal tendon weakness and various treatment options were discussed.  There appears to be decreasing muscular strength to the right peroneal tendon which in setting of having like pain in the lateral muscle group with spasming of  the group I believe patient will benefit from neurology work-up and a possible nerve conduction study.  I discussed this with the patient in extensive detail she states understanding. -I will order nerve conduction study and have her follow-up with a neurologist. -If there is no improvement we will discuss doing an MRI given that the previous MRI was done over 2 years ago.  I discussed this with patient extensive detail she states understanding. -Continue using orthotics  No follow-ups on file.

## 2020-08-31 ENCOUNTER — Encounter: Payer: Self-pay | Admitting: Neurology

## 2020-09-13 ENCOUNTER — Telehealth: Payer: Self-pay | Admitting: Podiatry

## 2020-09-13 NOTE — Telephone Encounter (Signed)
Patient called stating Neurologist cant get her in until October and its [place]. Patient would like to see someone in [place] sooner if possible.

## 2020-09-14 ENCOUNTER — Other Ambulatory Visit: Payer: Self-pay

## 2020-09-14 ENCOUNTER — Ambulatory Visit: Payer: BC Managed Care – PPO | Admitting: Podiatry

## 2020-09-14 DIAGNOSIS — M25571 Pain in right ankle and joints of right foot: Secondary | ICD-10-CM

## 2020-09-14 NOTE — Telephone Encounter (Signed)
Called (Christian)and faxed referral for Nerve Conduction([facility]),stat order. They will contact patient to reschedule for a sooner appointment.

## 2020-09-19 NOTE — Progress Notes (Signed)
Subjective:  Patient ID: Sue Cook, female    DOB: Jun 24, 1980,  MRN: [id number]  Chief Complaint  Patient presents with   Foot Pain    PT stated that she is still having some pain    40 y.o. female presents with the above complaint.  Patient presents with continuous complaint of weakness to the right peroneal tendon with adductovarus foot structure.  She has this pes cavus high arch foot structure.  I discussed with her that she will benefit from nerve conduction study to rule out any upper motor and or lower motor lesions.  We will attempt to reschedule the nerve conduction study.  She states that is been about the same she denies any other acute complaints.   Review of Systems: Negative except as noted in the HPI. Denies N/V/F/Ch.  Past Medical History:  Diagnosis Date   Ankle pain 05/2016   RIGHT-PT SEEING DR Ether Griffins AND GETTING CORTISONE INJECTIONS   Complication of anesthesia    GERD (gastroesophageal reflux disease)    rare-NO MEDS   Headache    MIGRAINES   PONV (postoperative nausea and vomiting)     Current Outpatient Medications:    cyclobenzaprine (FLEXERIL) 10 MG tablet, Take by mouth., Disp: , Rfl:    diclofenac (VOLTAREN) 75 MG EC tablet, TAKE 1 TABLET BY MOUTH TWICE A DAY (Patient taking differently: as needed.), Disp: 60 tablet, Rfl: 3   ibuprofen (ADVIL,MOTRIN) 200 MG tablet, Take 600 mg by mouth every 6 (six) hours as needed for mild pain., Disp: , Rfl:    meloxicam (MOBIC) 15 MG tablet, TAKE 1 TABLET BY MOUTH EVERY DAY (Patient not taking: No sig reported), Disp: , Rfl:    naproxen (NAPROSYN) 500 MG tablet, Take 500 mg by mouth 2 (two) times daily with a meal., Disp: , Rfl:    SUMAtriptan (IMITREX) 100 MG tablet, TAKE 1 TABLET BY MOUTH ONCE AS NEEDED FOR MIGRAINE. MAY TAKE SECOND DOSE AFTER 2 HRS IF NEEDED, Disp: , Rfl: 0  Social History   Tobacco Use  Smoking Status Never  Smokeless Tobacco Never    No Known Allergies Objective:  There were no vitals  filed for this visit. There is no height or weight on file to calculate BMI. Constitutional Well developed. Well nourished.  Vascular Dorsalis pedis pulses palpable bilaterally. Posterior tibial pulses palpable bilaterally. Capillary refill normal to all digits.  No cyanosis or clubbing noted. Pedal hair growth normal.  Neurologic Normal speech. Oriented to person, place, and time. Epicritic sensation to light touch grossly present bilaterally.  Dermatologic Nails well groomed and normal in appearance. No open wounds. No skin lesions.  Orthopedic: Pain on palpation along the course of the peroneal tendon extending into the muscle group.  Decreased muscle strength noted of the peroneal tendons.  Increased muscle strength noted of the posterior tibial tendon.  Foot structure appears to be high arch foot structure with cable adductovarus foot structure.   Radiographs: 3 views of skeletally mature the right foot: There is decreasing calcaneal inclination angle increasing talar declination angleOsteoarthritic changes noted at the subtalar joint.  No signs of calcaneonavicular coalition noted.:  No bony abnormalities identified. 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  Right peroneal tendon weakness with an MRI flexible cavus foot structure -Clinically I discussed with the patient the etiology of peroneal tendon weakness and various treatment options were discussed.  There appears  to be decreasing muscular strength to the right peroneal tendon which in setting of having like pain in the lateral muscle group with spasming of the group I believe patient will benefit from neurology work-up and a possible nerve conduction study.  I discussed this with the patient in extensive detail she states understanding. -I will order nerve conduction study and have her follow-up with a neurologist.  I will obtain to  reschedule the neurology conduction test sooner. -I will reorder the MRI to further evaluate the ankle. -Continue using orthotics  No follow-ups on file.

## 2020-09-20 ENCOUNTER — Telehealth: Payer: Self-pay | Admitting: *Deleted

## 2020-09-21 NOTE — Telephone Encounter (Signed)
Called [facility] for a sooner stat appointment. They said that their doctors reviewed referral since  a new patient, has to have a consultation first and then the nerve conduction appointment follows which  is the first available in Oct.  [facility], soonest available is Sept. 23 rd, sent referral 09/21/20.  Called patient to inform of change,no answer, could not leave vmessage. Will try again later to reach patient.

## 2020-09-21 NOTE — Telephone Encounter (Signed)
-----   Message from Candelaria Stagers, DPM sent at 09/19/2020  8:59 AM EDT ----- Regarding: Nerve conduction study reschedule Hi Link Burgeson,   She has a study scheduled for sometime in October.  Can you see if they can get her in sooner.  Thank you

## 2020-09-21 NOTE — Telephone Encounter (Signed)
Returned call to patient to inform that referral has been sent to [facility] for a sooner appointment. No answer, could not leave vmessage.

## 2020-09-22 NOTE — Telephone Encounter (Signed)
Called patient and spoke with husband , stated that patient had her Nerve study today @[facility]. Called (3 times) [facility], could not reach to cancel the  scheduled in October appointment. Will try later.

## 2020-09-29 DIAGNOSIS — M79676 Pain in unspecified toe(s): Secondary | ICD-10-CM

## 2020-10-04 ENCOUNTER — Ambulatory Visit
Admission: RE | Admit: 2020-10-04 | Discharge: 2020-10-04 | Disposition: A | Discharge status: Home / Self Care | Payer: BC Managed Care – PPO | Source: Ambulatory Visit | Attending: Podiatry | Admitting: Podiatry

## 2020-10-04 ENCOUNTER — Other Ambulatory Visit: Payer: Self-pay

## 2020-10-04 DIAGNOSIS — M25571 Pain in right ankle and joints of right foot: Secondary | ICD-10-CM

## 2020-10-04 IMAGING — MR MR ANKLE*R* W/O CM
5 series · 39 of 40 positions shown · non-contrast
Comparison: Right foot radiograph [DATE], right ankle MRI
[DATE]

CLINICAL DATA: Ankle pain, tendon abnormality suspected, neg xray
Peroneal tendinitis right

EXAM:
MRI OF THE RIGHT ANKLE WITHOUT CONTRAST
TECHNIQUE: Multiplanar, multisequence MR imaging of the ankle was performed. No
intravenous contrast was administered.

[Series 4: T2 fat-sat · axial · 3.0mm · 0.50mm/px · z∈[-111,+10]mm · 10 of 32 slices shown (1 of 2)]
[im 1/32]
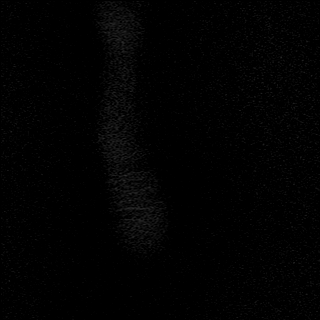
[im 4/32]
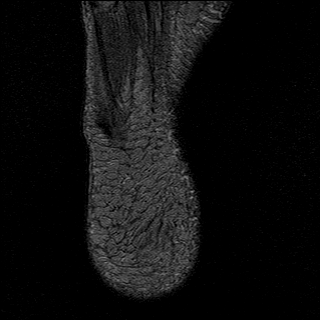
[im 7/32]
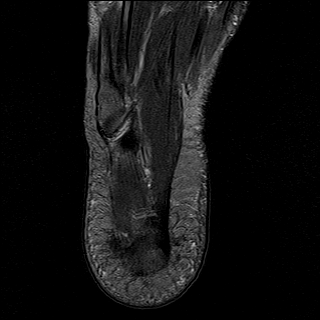
[im 11/32]
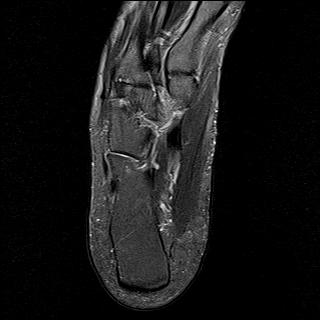
[im 14/32]
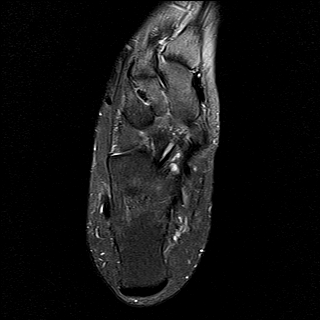
[im 18/32]
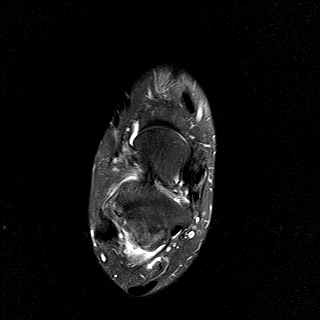
[im 21/32]
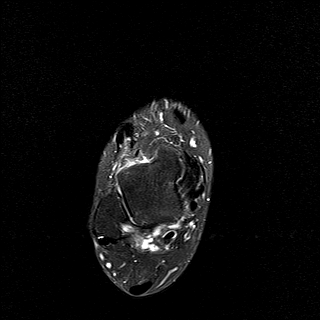
[im 25/32]
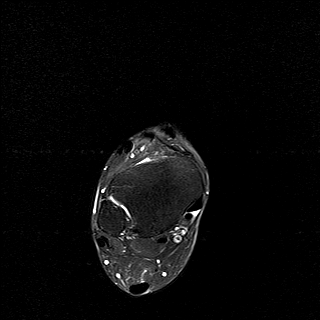
[im 28/32]
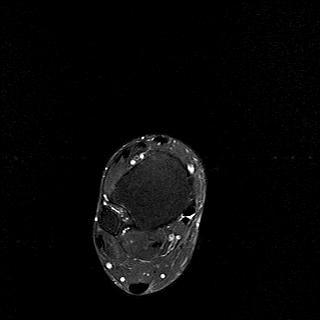
[im 32/32]
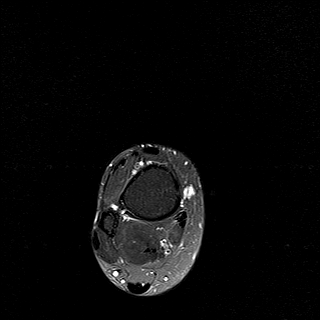

[Series 5: PD fat-sat · axial · 3.0mm · 0.50mm/px · z∈[-111,+10]mm · 10 of 32 slices shown]
[im 1/32]
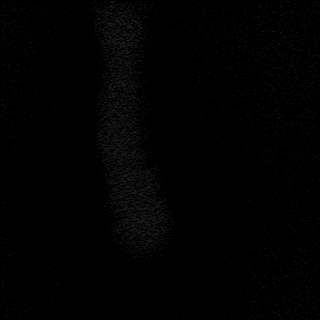
[im 4/32]
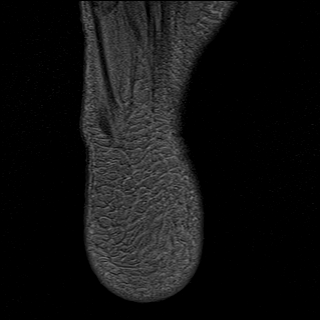
[im 7/32]
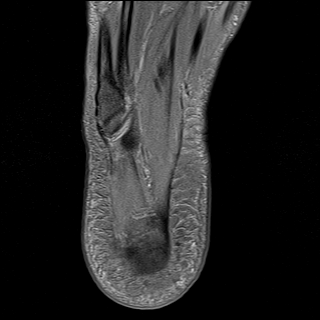
[im 11/32]
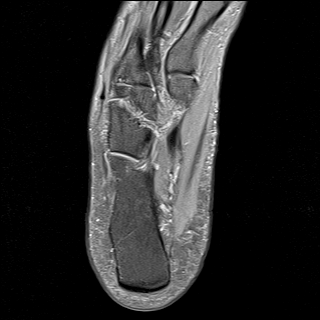
[im 14/32]
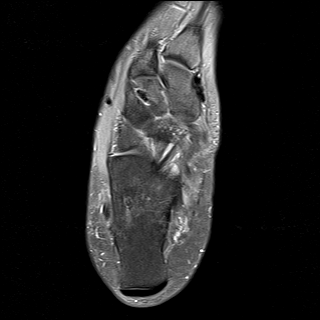
[im 18/32]
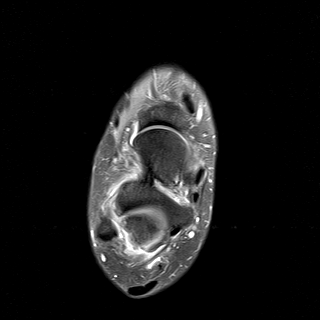
[im 21/32]
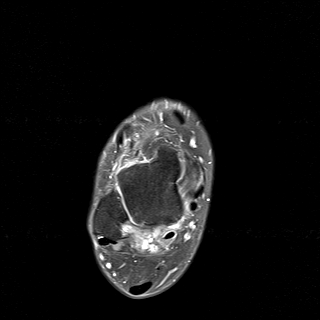
[im 25/32]
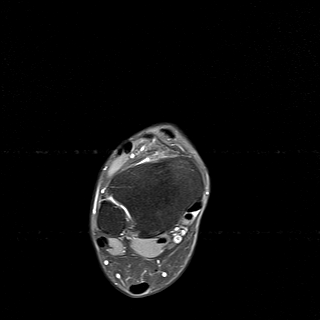
[im 28/32]
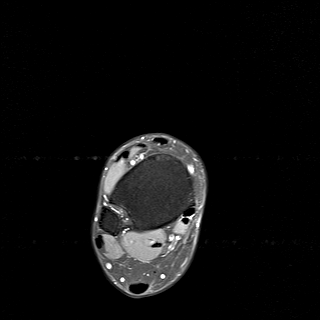
[im 32/32]
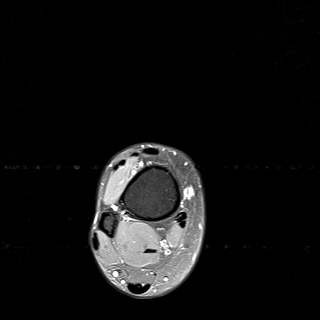

[Series 6: T1 · sagittal · 4.0mm · 0.56mm/px · 5 of 18 slices shown]
[im 1/18]
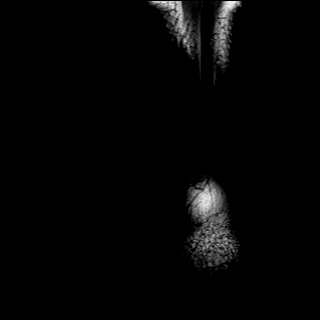
[im 5/18]
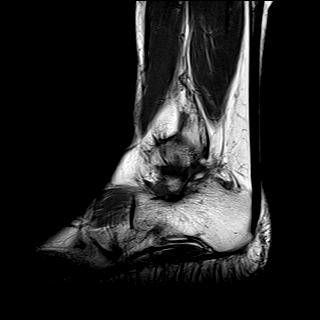
[im 9/18]
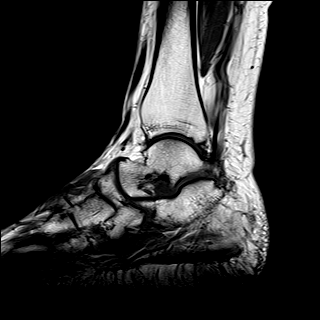
[im 13/18]
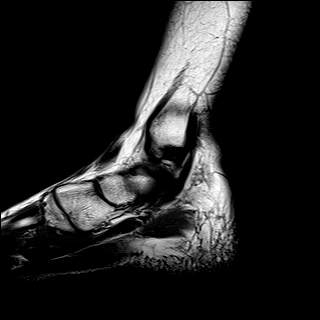
[im 18/18]
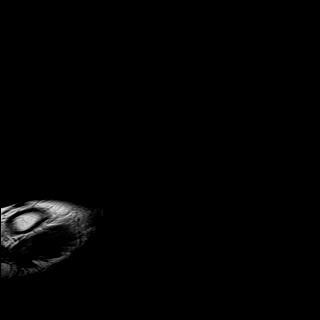

[Series 7: STIR · sagittal · 4.0mm · 0.35mm/px · 5 of 18 slices shown]
[im 1/18]
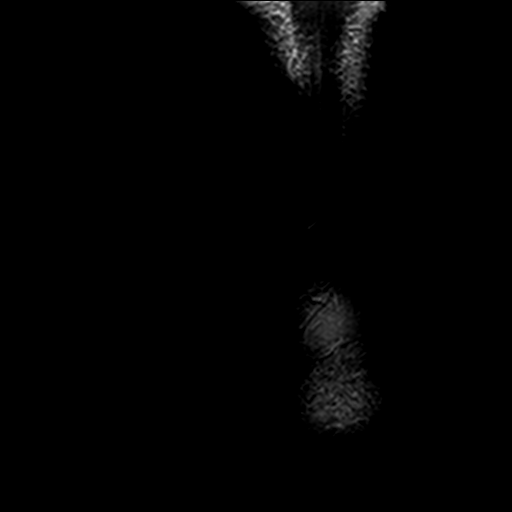
[im 5/18]
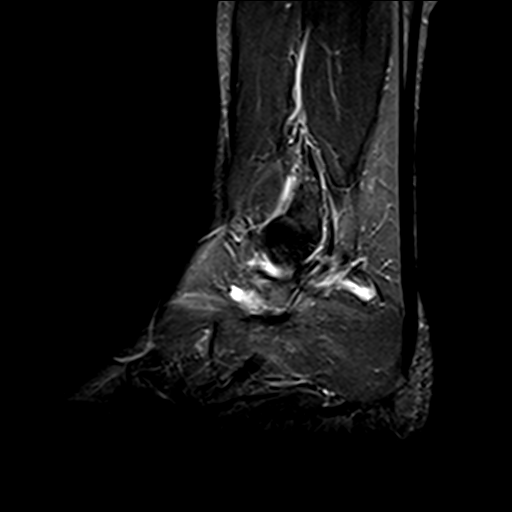
[im 9/18]
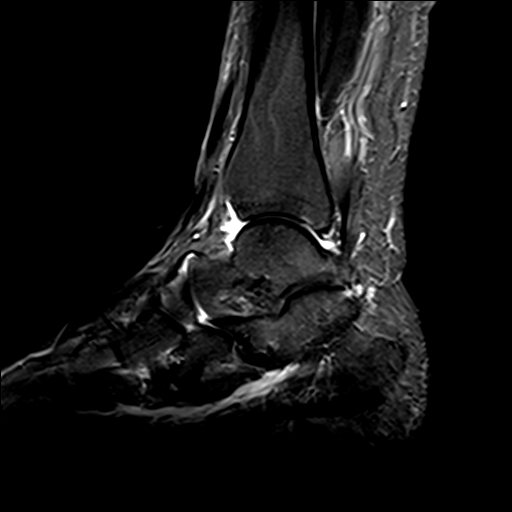
[im 13/18]
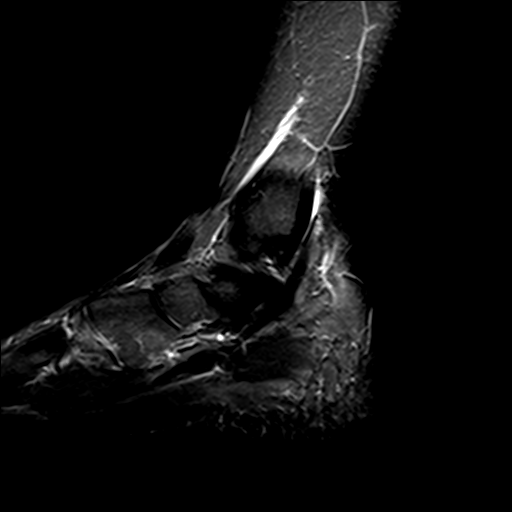
[im 18/18]
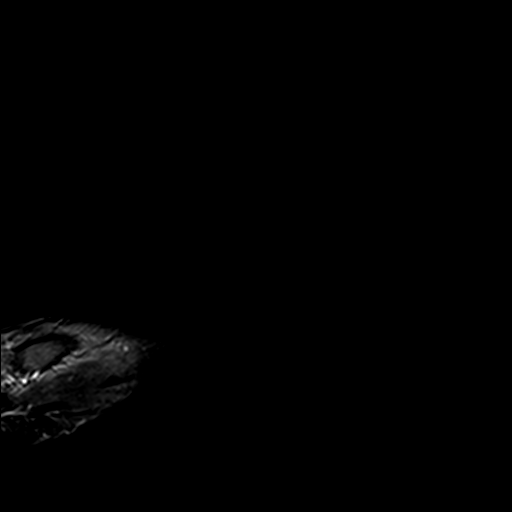

[Series 8: T2 fat-sat · coronal · 3.0mm · 0.50mm/px · 9 of 35 slices shown (2 of 2)]
[im 1/35]
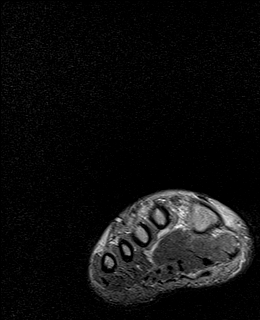
[im 4/35]
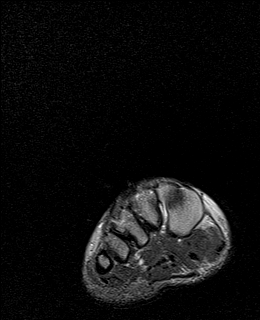
[im 8/35]
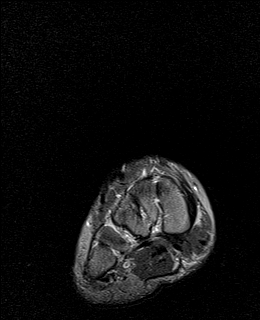
[im 12/35]
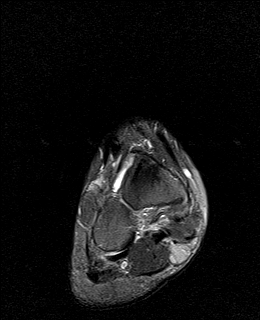
[im 16/35]
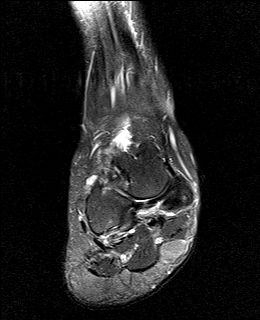
[im 19/35]
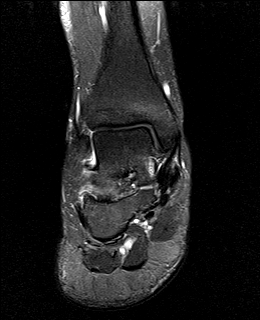
[im 23/35]
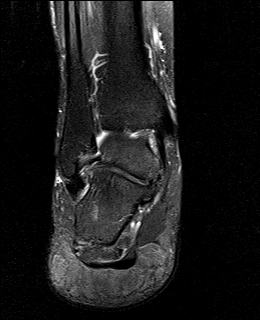
[im 31/35]
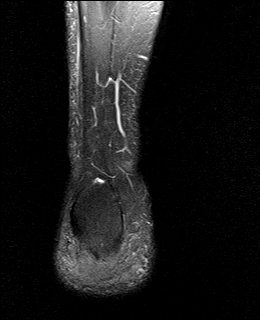
[im 35/35]
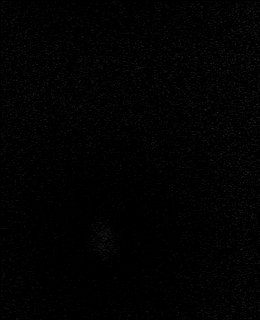

[39 of 40 positions shown; findings below may reference images not displayed]

FINDINGS: TENDONS

Peroneal: Peroneal longus and brevis tendons are intact.

Posteromedial: Mild tenosynovitis of the posterior tibial tendon
above and below the malleolus. Flexor digitorum longus tendon
intact. Flexor hallucis longus tendon intact.

Anterior: Tibialis anterior tendon intact. Extensor hallucis longus
tendon intact Extensor digitorum longus tendon intact.

Achilles:  Intact.

Plantar Fascia: Thickening of the medial bundle plantar fascia near
its insertion at the calcaneus. No increased signal.

LIGAMENTS

Lateral: Anterior talofibular ligament intact. Calcaneofibular
ligament intact. Posterior talofibular ligament intact. Anterior and
posterior tibiofibular ligaments intact.

Medial: Deltoid ligament intact. Spring ligament intact.

CARTILAGE

Ankle Joint: No joint effusion. Normal ankle mortise. No chondral
defect.

Subtalar Joints/Sinus Tarsi: Low T1 signal within the sinus tarsi,
consistent with scarring. Mild posterior subtalar arthritis with
reactive marrow changes.

Bones: There is mild osteoarthritis of the second tarsometatarsal
joint with reactive marrow changes.

Soft Tissue: No fluid collection or hematoma. Muscles are normal
without edema or atrophy. Tarsal tunnel is normal.
IMPRESSION: Intact peroneal tendons and lateral ankle ligaments.

Mild posterior subtalar osteoarthritis. Mild second TMT joint
osteoarthritis.

Mild tenosynovitis of the posterior tibial tendon.

Thickening of the medial band plantar fascia at the calcaneal
insertion, consistent with prior plantar fasciitis.

## 2020-10-06 ENCOUNTER — Encounter: Payer: Self-pay | Admitting: Podiatry

## 2020-10-12 ENCOUNTER — Encounter: Payer: Self-pay | Admitting: Podiatry

## 2020-10-12 ENCOUNTER — Ambulatory Visit (INDEPENDENT_AMBULATORY_CARE_PROVIDER_SITE_OTHER): Payer: BC Managed Care – PPO | Admitting: Podiatry

## 2020-10-12 ENCOUNTER — Other Ambulatory Visit: Payer: Self-pay

## 2020-10-12 DIAGNOSIS — M25571 Pain in right ankle and joints of right foot: Secondary | ICD-10-CM

## 2020-10-12 NOTE — Progress Notes (Signed)
Subjective:  Patient ID: Sue Cook, female    DOB: 10-Oct-1980,  MRN: [id number]  Chief Complaint  Patient presents with   Foot Pain    "It hurts the worst at work and after work.  I have a stabbing pain in the ankle and a lot of popping."    40 y.o. female presents with the above complaint.  Patient presents with follow-up of weakness to the right peroneal tendon with MRI being done and nerve conduction study completed.  She wanted to evaluate further option.  She states that she is still hurting in the foot has not gotten better hurts with working.   Review of Systems: Negative except as noted in the HPI. Denies N/V/F/Ch.  Past Medical History:  Diagnosis Date   Ankle pain 05/2016   RIGHT-PT SEEING DR Ether Griffins AND GETTING CORTISONE INJECTIONS   Complication of anesthesia    GERD (gastroesophageal reflux disease)    rare-NO MEDS   Headache    MIGRAINES   PONV (postoperative nausea and vomiting)     Current Outpatient Medications:    cyclobenzaprine (FLEXERIL) 10 MG tablet, Take by mouth., Disp: , Rfl:    diclofenac (VOLTAREN) 75 MG EC tablet, TAKE 1 TABLET BY MOUTH TWICE A DAY (Patient taking differently: as needed.), Disp: 60 tablet, Rfl: 3   ibuprofen (ADVIL,MOTRIN) 200 MG tablet, Take 600 mg by mouth every 6 (six) hours as needed for mild pain., Disp: , Rfl:    meloxicam (MOBIC) 15 MG tablet, TAKE 1 TABLET BY MOUTH EVERY DAY (Patient not taking: No sig reported), Disp: , Rfl:    naproxen (NAPROSYN) 500 MG tablet, Take 500 mg by mouth 2 (two) times daily with a meal., Disp: , Rfl:    SUMAtriptan (IMITREX) 100 MG tablet, TAKE 1 TABLET BY MOUTH ONCE AS NEEDED FOR MIGRAINE. MAY TAKE SECOND DOSE AFTER 2 HRS IF NEEDED, Disp: , Rfl: 0  Social History   Tobacco Use  Smoking Status Never  Smokeless Tobacco Never    No Known Allergies Objective:  There were no vitals filed for this visit. There is no height or weight on file to calculate BMI. Constitutional Well  developed. Well nourished.  Vascular Dorsalis pedis pulses palpable bilaterally. Posterior tibial pulses palpable bilaterally. Capillary refill normal to all digits.  No cyanosis or clubbing noted. Pedal hair growth normal.  Neurologic Normal speech. Oriented to person, place, and time. Epicritic sensation to light touch grossly present bilaterally.  Dermatologic Nails well groomed and normal in appearance. No open wounds. No skin lesions.  Orthopedic: Pain on palpation along the course of the peroneal tendon extending into the muscle group.  Decreased muscle strength noted of the peroneal tendons.  Increased muscle strength noted of the posterior tibial tendon.  Foot structure appears to be high arch foot structure with cable adductovarus foot structure.   Radiographs: 3 views of skeletally mature the right foot: There is decreasing calcaneal inclination angle increasing talar declination angleOsteoarthritic changes noted at the subtalar joint.  No signs of calcaneonavicular coalition noted.:  No bony abnormalities identified. 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  Right peroneal tendon weakness with an MRI flexible cavus foot structure -Clinically I discussed with the patient the etiology of peroneal tendon weakness and various treatment options were discussed.  At this time I am am unsure if there is any neurological correlation with peroneal tendon weakness.  Given that her nerve conduction study was within normal limits. -Nerve conduction was within normal limits. -MRI was reviewed with the patient in extensive detail.  At this time there is no peroneal tendinopathy that is noted.  There was a component of subtalar joint minimal arthritis noted.  Mild tenosynovitis of the posterior tibial tendon noted as well.  No gross abnormalities identified -I will plan on getting a CT scan to evaluate  further into bony pathology.  Patient agrees with plan like to proceed with CT scan -Continue using orthotics -The likely culprit might be constant standing and working on her foot at this time patient will benefit from short-term disability/FMLA to be taken out of work for approximately 6 weeks.  No follow-ups on file.

## 2020-10-20 DIAGNOSIS — M79676 Pain in unspecified toe(s): Secondary | ICD-10-CM

## 2020-10-25 ENCOUNTER — Ambulatory Visit
Admission: RE | Admit: 2020-10-25 | Discharge: 2020-10-25 | Disposition: A | Discharge status: Home / Self Care | Payer: BC Managed Care – PPO | Source: Ambulatory Visit | Attending: Podiatry | Admitting: Podiatry

## 2020-10-25 DIAGNOSIS — M25571 Pain in right ankle and joints of right foot: Secondary | ICD-10-CM

## 2020-10-25 IMAGING — CT CT ANKLE*R* W/O CM
3 series · 12 of 33 positions shown, 14 images · non-contrast
Comparison: MRI of the right ankle dated [DATE]

CLINICAL DATA: Chronic ankle pain, subtalar joint arthropathy,
possible rheumatoid arthritis. Ankle locking and spasms.

EXAM:
CT OF THE RIGHT ANKLE WITHOUT CONTRAST
TECHNIQUE: Multidetector CT imaging of the right ankle was performed according
to the standard protocol. Multiplanar CT image reconstructions were
also generated.

[Series 5: sfov lower extremity 2.00 br40 s3 soft · axial · 0.23mm/px · z∈[+509,+635]mm · 4 of 92 slices shown, 5 images (1 of 3)]
[im 15/92  soft-tissue]
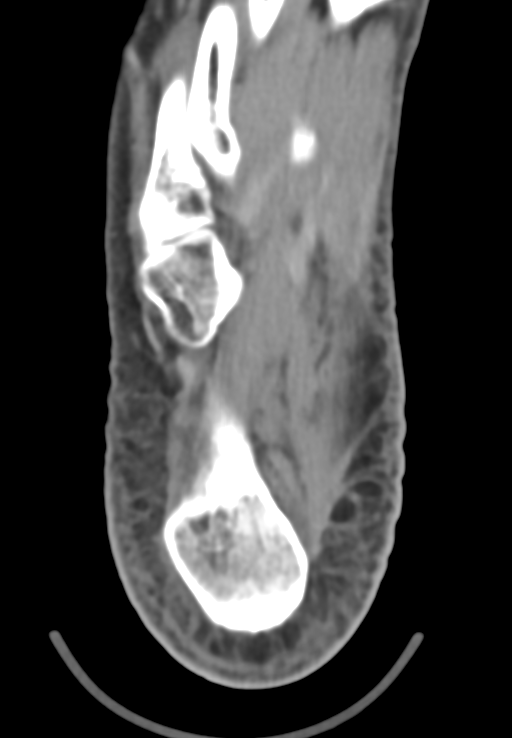
[im 15/92  bone]
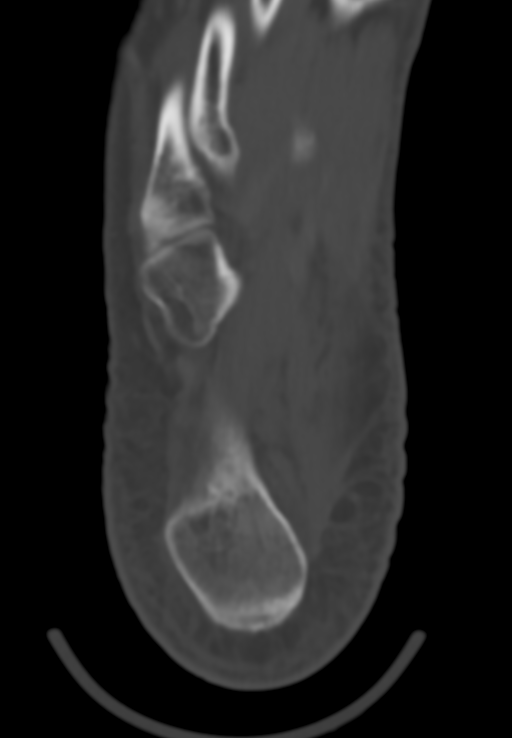
[im 36/92  bone]
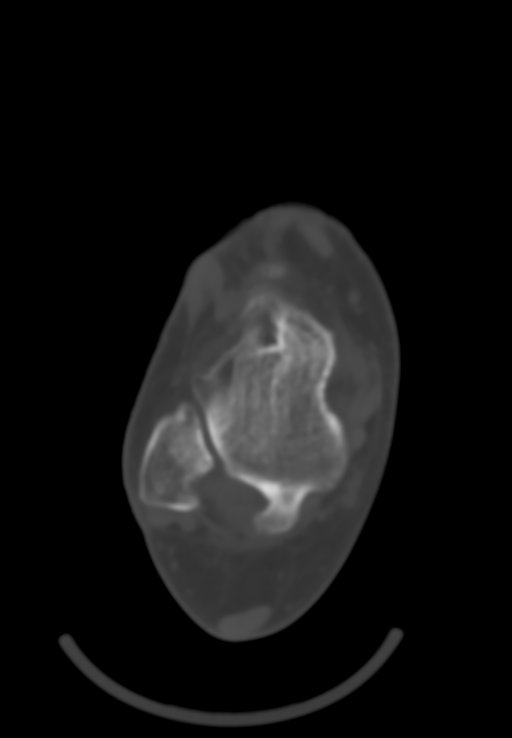
[im 57/92  bone]
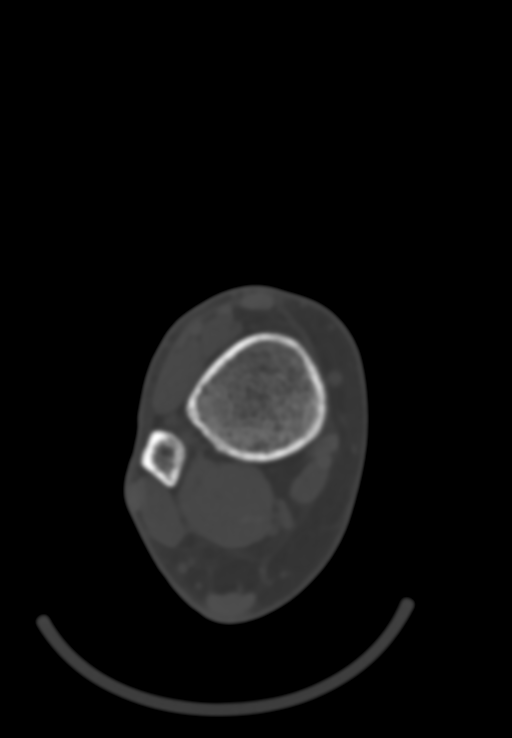
[im 78/92  bone]
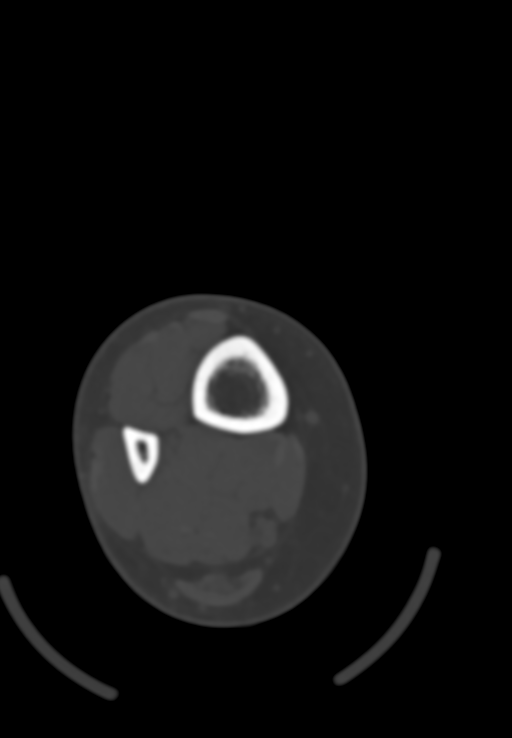

[Series 9: sfov lower extremity 2.00 br40 s3 soft · coronal · 0.23mm/px · 3 of 85 slices shown (2 of 3)]
[im 17/85  bone]
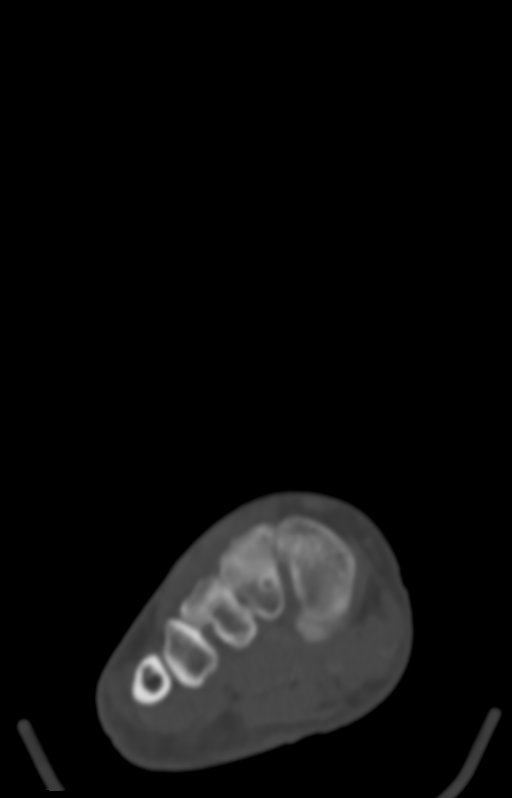
[im 34/85  bone]
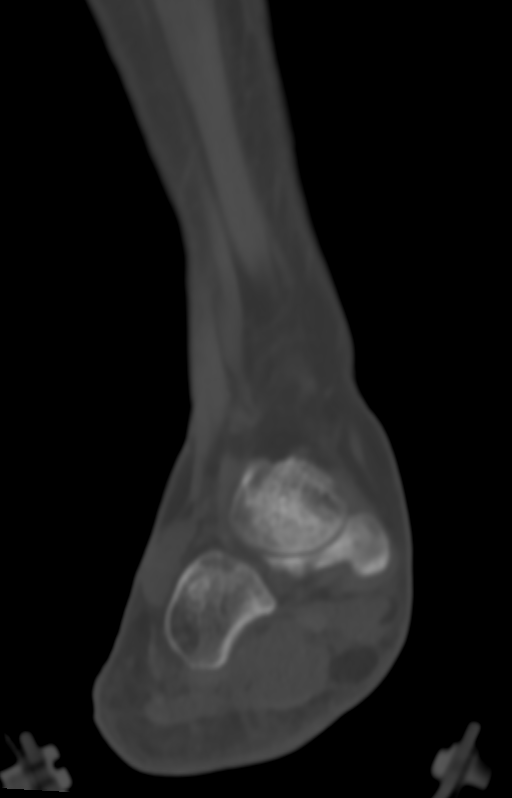
[im 51/85  bone]
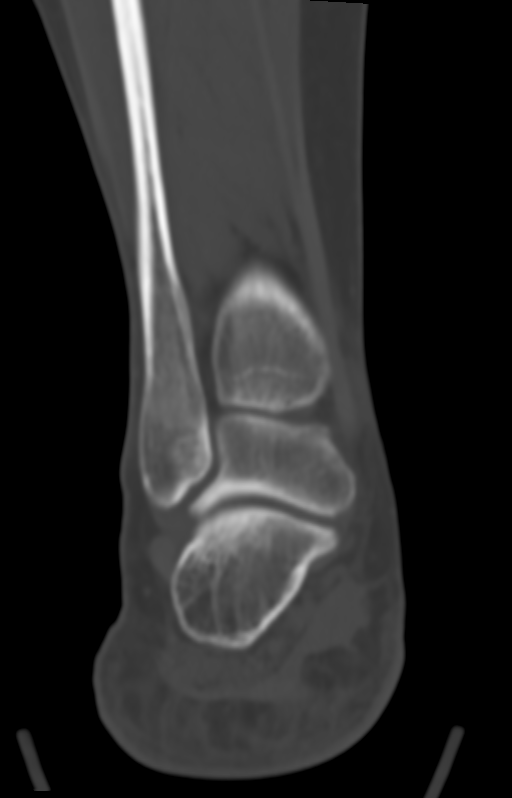

[Series 13: sfov lower extremity 2.00 br40 s3 soft · sagittal · 0.34mm/px · 5 of 59 slices shown, 6 images (3 of 3)]
[im 20/59  bone]
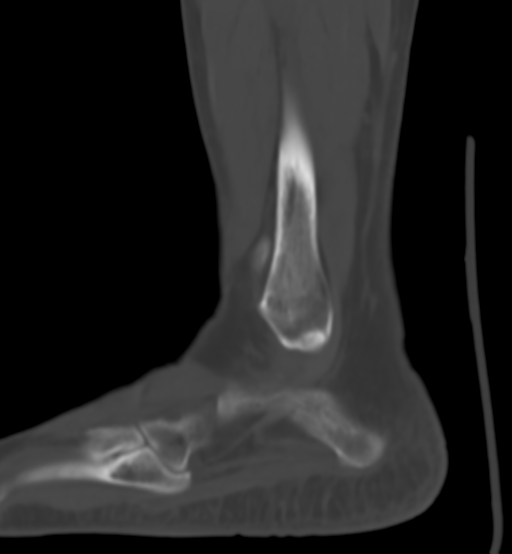
[im 25/59  bone]
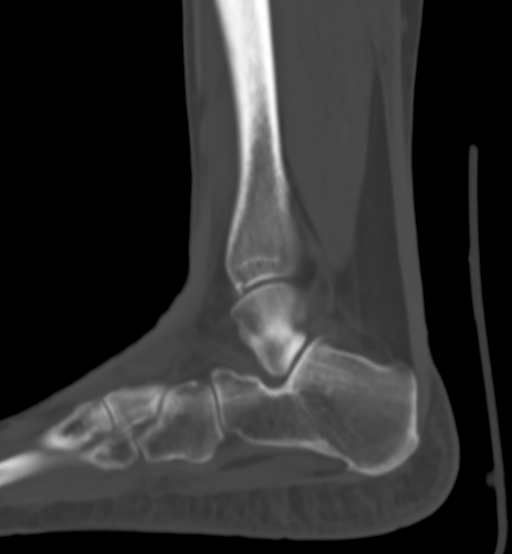
[im 30/59  soft-tissue]
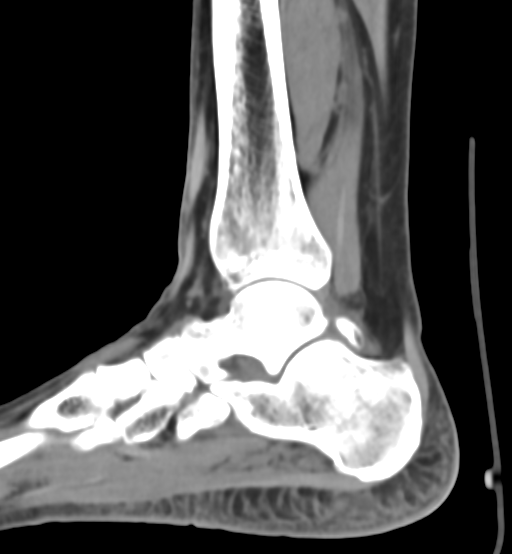
[im 30/59  bone]
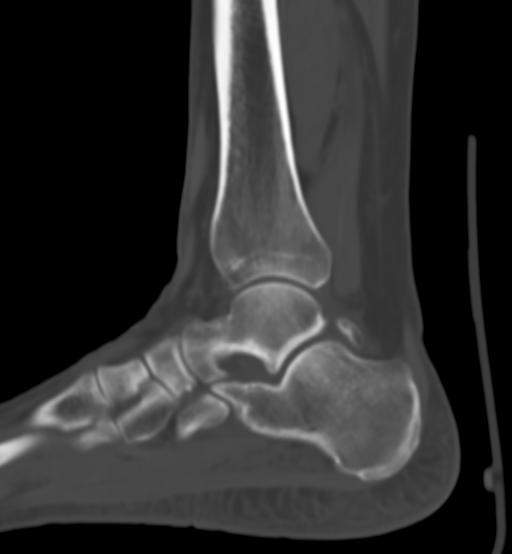
[im 34/59  bone]
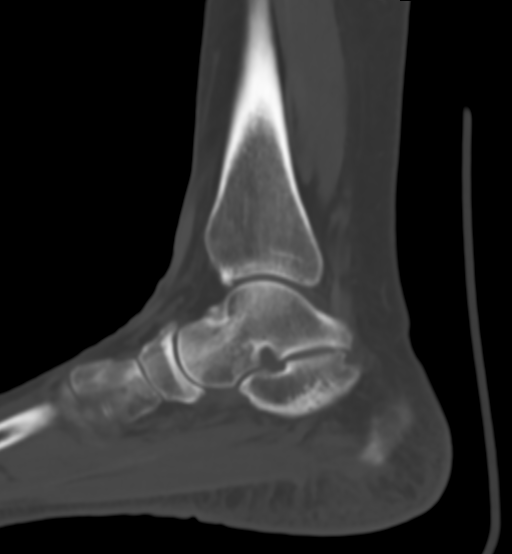
[im 39/59  bone]
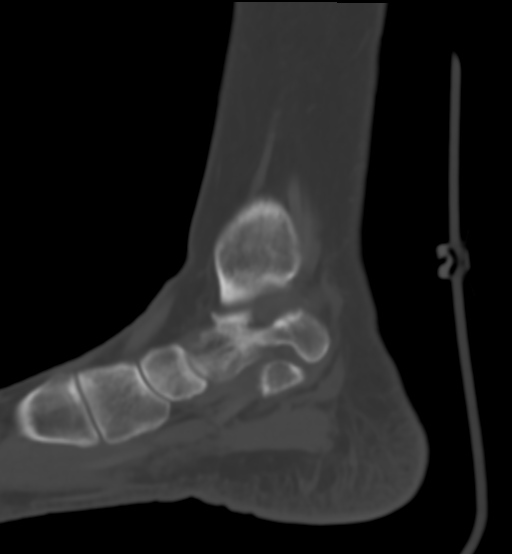

[12 of 33 positions shown; findings below may reference images not displayed]

FINDINGS: Bones/Joint/Cartilage

Somewhat prominent trigonal process of the talus but without
significant internal edema on recent MRI to indicate os trigonum
syndrome. At the site of mild marrow edema and arthropathy along the
talar side of the medial subtalar joint today we demonstrate only
mild spurring but no significant cortical irregularity.

Small plantar calcaneal spur.

Mild dorsal spurring of the proximal navicular.

Mild findings of osteoarthritis between the middle cuneiform and the
base of the second metatarsal with articular space narrowing and
mild spurring. Similar findings laterally between the lateral
cuneiform and the base of the third metatarsal.

No fracture or acute bony findings. Mild anterior spurring of the
distal margin of the tibia on image 26 series 11.

Ligaments

Suboptimally assessed by CT.

Muscles and Tendons

Mild thickening of the medial band of the plantar fascia as on
recent MRI.

Mild reduction in the typical fat density in the sinus tarsi which
may be from scarring.

Soft tissues

Subcutaneous edema noted along the plantar heel.
IMPRESSION: 1. There is some mild spurring medially along the subtalar joint in
the region of prior reactive marrow edema, but no overt sclerosis or
erosion is identified in this vicinity. Mild reduction typical fat
density in the sinus tarsi which may be from scarring.
2. Prominent trigonal process of the talus but without associated
edema to suggest os trigonum syndrome.
3. Small plantar calcaneal spur with thickening of the medial band
of the plantar fascia which could indicate plantar fasciitis.
4. Subcutaneous edema along the plantar heel.
5. Mild osteoarthritis along portions of the Lisfranc joint.

## 2020-10-26 DIAGNOSIS — M79676 Pain in unspecified toe(s): Secondary | ICD-10-CM

## 2020-11-03 ENCOUNTER — Ambulatory Visit: Payer: BC Managed Care – PPO | Admitting: Neurology

## 2020-11-13 ENCOUNTER — Encounter: Payer: Self-pay | Admitting: Podiatry

## 2020-11-21 ENCOUNTER — Encounter: Payer: BC Managed Care – PPO | Admitting: Neurology

## 2020-11-23 ENCOUNTER — Ambulatory Visit (INDEPENDENT_AMBULATORY_CARE_PROVIDER_SITE_OTHER): Payer: BC Managed Care – PPO | Admitting: Podiatry

## 2020-11-23 ENCOUNTER — Other Ambulatory Visit: Payer: Self-pay

## 2020-11-23 ENCOUNTER — Encounter: Payer: Self-pay | Admitting: Podiatry

## 2020-11-23 DIAGNOSIS — M25571 Pain in right ankle and joints of right foot: Secondary | ICD-10-CM | POA: Diagnosis not present

## 2020-11-23 DIAGNOSIS — M7671 Peroneal tendinitis, right leg: Secondary | ICD-10-CM

## 2020-11-23 NOTE — Progress Notes (Signed)
Subjective:  Patient ID: Sue Cook, female    DOB: 09-Sep-1980,  MRN: [id number]  Chief Complaint  Patient presents with   Foot Pain    Right foot pain  PT stated that it has got a little better since she has not been working     40 y.o. female presents with the above complaint.  Patient presents with follow-up to right peroneal tendinitis.  She states she is doing better because she has taken time off of work.  However she is unable to return to work because she does not have proper flooring to be able to stand for long period of time.  She would like to extend her work leave if possible.  She denies any other acute issues.   Review of Systems: Negative except as noted in the HPI. Denies N/V/F/Ch.  Past Medical History:  Diagnosis Date   Ankle pain 05/2016   RIGHT-PT SEEING DR Ether Griffins AND GETTING CORTISONE INJECTIONS   Complication of anesthesia    GERD (gastroesophageal reflux disease)    rare-NO MEDS   Headache    MIGRAINES   PONV (postoperative nausea and vomiting)     Current Outpatient Medications:    cyclobenzaprine (FLEXERIL) 10 MG tablet, Take by mouth., Disp: , Rfl:    diclofenac (VOLTAREN) 75 MG EC tablet, TAKE 1 TABLET BY MOUTH TWICE A DAY (Patient taking differently: as needed.), Disp: 60 tablet, Rfl: 3   ibuprofen (ADVIL,MOTRIN) 200 MG tablet, Take 600 mg by mouth every 6 (six) hours as needed for mild pain., Disp: , Rfl:    meloxicam (MOBIC) 15 MG tablet, TAKE 1 TABLET BY MOUTH EVERY DAY (Patient not taking: No sig reported), Disp: , Rfl:    naproxen (NAPROSYN) 500 MG tablet, Take 500 mg by mouth 2 (two) times daily with a meal., Disp: , Rfl:    SUMAtriptan (IMITREX) 100 MG tablet, TAKE 1 TABLET BY MOUTH ONCE AS NEEDED FOR MIGRAINE. MAY TAKE SECOND DOSE AFTER 2 HRS IF NEEDED, Disp: , Rfl: 0  Social History   Tobacco Use  Smoking Status Never  Smokeless Tobacco Never    No Known Allergies Objective:  There were no vitals filed for this visit. There is no  height or weight on file to calculate BMI. Constitutional Well developed. Well nourished.  Vascular Dorsalis pedis pulses palpable bilaterally. Posterior tibial pulses palpable bilaterally. Capillary refill normal to all digits.  No cyanosis or clubbing noted. Pedal hair growth normal.  Neurologic Normal speech. Oriented to person, place, and time. Epicritic sensation to light touch grossly present bilaterally.  Dermatologic Nails well groomed and normal in appearance. No open wounds. No skin lesions.  Orthopedic: Pain on palpation along the course of the peroneal tendon extending into the muscle group.  Decreased muscle strength noted of the peroneal tendons.  Increased muscle strength noted of the posterior tibial tendon.  Foot structure appears to be high arch foot structure with cable adductovarus foot structure.   Radiographs: 3 views of skeletally mature the right foot: There is decreasing calcaneal inclination angle increasing talar declination angleOsteoarthritic changes noted at the subtalar joint.  No signs of calcaneonavicular coalition noted.:  No bony abnormalities identified.  1. There is some mild spurring medially along the subtalar joint in the region of prior reactive marrow edema, but no overt sclerosis or erosion is identified in this vicinity. Mild reduction typical fat density in the sinus tarsi which may be from scarring. 2. Prominent trigonal process of the talus but without associated  edema to suggest os trigonum syndrome. 3. Small plantar calcaneal spur with thickening of the medial band of the plantar fascia which could indicate plantar fasciitis. 4. Subcutaneous edema along the plantar heel. 5. Mild osteoarthritis along portions of the Lisfranc joint. 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 Peroneal tendinitis of right lower extremity       Plan:  Patient was evaluated and treated and all questions answered.  Right peroneal tendon  weakness with an MRI flexible cavus foot structure -Clinically I discussed with the patient the etiology of peroneal tendon weakness and various treatment options were discussed.  At this time I am am unsure if there is any neurological correlation with peroneal tendon weakness.  Given that her nerve conduction study was within normal limits. -Nerve conduction was within normal limits. -MRI was reviewed with the patient in extensive detail.  At this time there is no peroneal tendinopathy that is noted.  There was a component of subtalar joint minimal arthritis noted.  Mild tenosynovitis of the posterior tibial tendon noted as well.  No gross abnormalities identified -CT scan was evaluated however no further pathology was seen correlating with area of her pain. -Continue using orthotics -The likely culprit might be constant standing and working on her foot at this time patient will benefit from short-term disability/FMLA to be taken out of work for another 6 weeks as she does not have an ideal work environment to return to.  No follow-ups on file.

## 2020-11-28 ENCOUNTER — Ambulatory Visit (INDEPENDENT_AMBULATORY_CARE_PROVIDER_SITE_OTHER): Payer: BC Managed Care – PPO | Admitting: Podiatry

## 2020-11-28 ENCOUNTER — Other Ambulatory Visit: Payer: Self-pay

## 2020-11-28 DIAGNOSIS — M7671 Peroneal tendinitis, right leg: Secondary | ICD-10-CM | POA: Diagnosis not present

## 2020-11-28 DIAGNOSIS — Z01818 Encounter for other preprocedural examination: Secondary | ICD-10-CM

## 2020-11-30 NOTE — Progress Notes (Signed)
Subjective:  Patient ID: Sue Cook, female    DOB: [birth year],  MRN: [id number]  Chief Complaint  Patient presents with   Foot Pain    Surgery paperwork     40 y.o. female presents with the above complaint.  Patient presents with follow-up to right peroneal tendinitis.  She states the pain has not gotten better.  She would like to discuss surgical options at this time.  She denies any other acute complaints.  She has failed all conservative treatment options at this time.   Review of Systems: Negative except as noted in the HPI. Denies N/V/F/Ch.  Past Medical History:  Diagnosis Date   Ankle pain 05/2016   RIGHT-PT SEEING DR Ether Griffins AND GETTING CORTISONE INJECTIONS   Complication of anesthesia    GERD (gastroesophageal reflux disease)    rare-NO MEDS   Headache    MIGRAINES   PONV (postoperative nausea and vomiting)     Current Outpatient Medications:    cyclobenzaprine (FLEXERIL) 10 MG tablet, Take by mouth., Disp: , Rfl:    diclofenac (VOLTAREN) 75 MG EC tablet, TAKE 1 TABLET BY MOUTH TWICE A DAY (Patient taking differently: as needed.), Disp: 60 tablet, Rfl: 3   ibuprofen (ADVIL,MOTRIN) 200 MG tablet, Take 600 mg by mouth every 6 (six) hours as needed for mild pain., Disp: , Rfl:    meloxicam (MOBIC) 15 MG tablet, TAKE 1 TABLET BY MOUTH EVERY DAY (Patient not taking: No sig reported), Disp: , Rfl:    naproxen (NAPROSYN) 500 MG tablet, Take 500 mg by mouth 2 (two) times daily with a meal., Disp: , Rfl:    SUMAtriptan (IMITREX) 100 MG tablet, TAKE 1 TABLET BY MOUTH ONCE AS NEEDED FOR MIGRAINE. MAY TAKE SECOND DOSE AFTER 2 HRS IF NEEDED, Disp: , Rfl: 0  Social History   Tobacco Use  Smoking Status Never  Smokeless Tobacco Never    No Known Allergies Objective:  There were no vitals filed for this visit. There is no height or weight on file to calculate BMI. Constitutional Well developed. Well nourished.  Vascular Dorsalis pedis pulses palpable  bilaterally. Posterior tibial pulses palpable bilaterally. Capillary refill normal to all digits.  No cyanosis or clubbing noted. Pedal hair growth normal.  Neurologic Normal speech. Oriented to person, place, and time. Epicritic sensation to light touch grossly present bilaterally.  Dermatologic Nails well groomed and normal in appearance. No open wounds. No skin lesions.  Orthopedic: Pain on palpation along the course of the peroneal tendon extending into the muscle group.  Decreased muscle strength noted of the peroneal tendons.  Increased muscle strength noted of the posterior tibial tendon.  Foot structure appears to be high arch foot structure with cable adductovarus foot structure.   Radiographs: 3 views of skeletally mature the right foot: There is decreasing calcaneal inclination angle increasing talar declination angleOsteoarthritic changes noted at the subtalar joint.  No signs of calcaneonavicular coalition noted.:  No bony abnormalities identified.  1. There is some mild spurring medially along the subtalar joint in the region of prior reactive marrow edema, but no overt sclerosis or erosion is identified in this vicinity. Mild reduction typical fat density in the sinus tarsi which may be from scarring. 2. Prominent trigonal process of the talus but without associated edema to suggest os trigonum syndrome. 3. Small plantar calcaneal spur with thickening of the medial band of the plantar fascia which could indicate plantar fasciitis. 4. Subcutaneous edema along the plantar heel. 5. 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 Assessment:   1. Peroneal tendinitis of right lower extremity   2. Preoperative examination        Plan:  Patient was evaluated and treated and all questions answered.  Right peroneal tendon weakness with an MRI flexible cavus foot structure -Clinically at this time her symptom is right at the peroneal tendon and even in the setting  of negative MRI given that all of her's clinical signs and symptoms point to peroneal tendinitis that has not been resolved with injection and cam boot immobilization and splinting offloading I believe patient will benefit from surgical exploration and a possible tenosynovectomy.  I discussed my preoperative postoperative intraoperative plan in extensive detail with the patient and given that she has failed all conservative treatment options she would benefit from surgical options at this time.  I discussed with her that if there is any longitudinal tearing I will plan on repairing it as well.  She states understanding and would like to proceed with the surgery. -She will be nonweightbearing after the surgery to the right lower extremity. -Informed surgical risk consent was reviewed and read aloud to the patient.  I reviewed the films.  I have discussed my findings with the patient in great detail.  I have discussed all risks including but not limited to infection, stiffness, scarring, limp, disability, deformity, damage to blood vessels and nerves, numbness, poor healing, need for braces, arthritis, chronic pain, amputation, death.  All benefits and realistic expectations discussed in great detail.  I have made no promises as to the outcome.  I have provided realistic expectations.  I have offered the patient a 2nd opinion, which they have declined and assured me they preferred to proceed despite the risks   No follow-ups on file.

## 2020-12-05 ENCOUNTER — Telehealth: Payer: Self-pay | Admitting: Urology

## 2020-12-05 NOTE — Telephone Encounter (Signed)
DOS - 11/21   REPAIR PERONEAL TENDON RIGHT --- 56389   [employer] EFFECTIVE DATE - 08/21/13   SPOKE WITH KAY M. WITH [employer] AND SHE STATED THAT FOR CPT CODE 37342 NO PRIOR AUTH IS REQUIRED.  REF # I - [id number]

## 2020-12-11 ENCOUNTER — Other Ambulatory Visit: Payer: Self-pay | Admitting: Podiatry

## 2020-12-11 DIAGNOSIS — M7671 Peroneal tendinitis, right leg: Secondary | ICD-10-CM

## 2020-12-11 DIAGNOSIS — M65871 Other synovitis and tenosynovitis, right ankle and foot: Secondary | ICD-10-CM

## 2020-12-11 MED ORDER — OXYCODONE-ACETAMINOPHEN 5-325 MG PO TABS
1.0000 | ORAL_TABLET | ORAL | 0 refills | Status: DC | PRN
Start: 2020-12-11 — End: 2021-10-11

## 2020-12-11 MED ORDER — IBUPROFEN 800 MG PO TABS: 800.0000 mg | ORAL_TABLET | Freq: Four times a day (QID) | ORAL | 1 refills | Status: DC | PRN

## 2020-12-18 ENCOUNTER — Telehealth: Payer: Self-pay | Admitting: *Deleted

## 2020-12-18 NOTE — Telephone Encounter (Signed)
I am returning your call.  Dr. Allena Katz said do not take the boot off.  I apologize for not calling you back sooner, we were closed Friday.  "I didn't take it off.  I had called one day last week and left a message on the nurse night line.  I didn't get a call back.  I was experiencing a weird feeling in my foot, the numbness had worn off.  It's fine now."  I'm sorry, I am not sure of what may have happened.  "I'll see him tomorrow for my appointment."

## 2020-12-18 NOTE — Telephone Encounter (Signed)
"  I'm a patient of Dr. Allena Katz.  I had surgery this past Monday.  I have a question.  I think I remember them telling me to not to take the boot off at all.  I haven't taken it off at all.  I just want to make sure that is correct.  I don't come in to the office until my post op appointment on Tuesday."

## 2020-12-19 ENCOUNTER — Ambulatory Visit (INDEPENDENT_AMBULATORY_CARE_PROVIDER_SITE_OTHER): Payer: BC Managed Care – PPO | Admitting: Podiatry

## 2020-12-19 ENCOUNTER — Other Ambulatory Visit: Payer: Self-pay

## 2020-12-19 DIAGNOSIS — Z9889 Other specified postprocedural states: Secondary | ICD-10-CM

## 2020-12-19 DIAGNOSIS — M7671 Peroneal tendinitis, right leg: Secondary | ICD-10-CM

## 2020-12-19 NOTE — Progress Notes (Signed)
  Subjective:  Patient ID: Sue Cook, female    DOB: [birth year],  MRN: [id number]  Chief Complaint  Patient presents with   Routine Post Op    POV #1 DOS 12/11/2020 RT PERONEAL TENDON REPAIR W/TENOSYNOVECTOMY    DOS: 12/11/2020 Procedure: Right peroneal tendon repair with tenosynovectomy  40 y.o. female returns for post-op check.  Patient states that she is doing well.  She is controlling her pain with ibuprofen only.  She has been nonweightbearing to the right lower extremity with knee scooter.  Bandages clean dry and intact.  Review of Systems: Negative except as noted in the HPI. Denies N/V/F/Ch.  Past Medical History:  Diagnosis Date   Ankle pain 05/2016   RIGHT-PT SEEING DR Ether Griffins AND GETTING CORTISONE INJECTIONS   Complication of anesthesia    GERD (gastroesophageal reflux disease)    rare-NO MEDS   Headache    MIGRAINES   PONV (postoperative nausea and vomiting)     Current Outpatient Medications:    cyclobenzaprine (FLEXERIL) 10 MG tablet, Take by mouth., Disp: , Rfl:    diclofenac (VOLTAREN) 75 MG EC tablet, TAKE 1 TABLET BY MOUTH TWICE A DAY (Patient taking differently: as needed.), Disp: 60 tablet, Rfl: 3   ibuprofen (ADVIL) 800 MG tablet, Take 1 tablet (800 mg total) by mouth every 6 (six) hours as needed., Disp: 60 tablet, Rfl: 1   ibuprofen (ADVIL,MOTRIN) 200 MG tablet, Take 600 mg by mouth every 6 (six) hours as needed for mild pain., Disp: , Rfl:    meloxicam (MOBIC) 15 MG tablet, TAKE 1 TABLET BY MOUTH EVERY DAY (Patient not taking: No sig reported), Disp: , Rfl:    naproxen (NAPROSYN) 500 MG tablet, Take 500 mg by mouth 2 (two) times daily with a meal., Disp: , Rfl:    oxyCODONE-acetaminophen (PERCOCET) 5-325 MG tablet, Take 1 tablet by mouth every 4 (four) hours as needed for severe pain., Disp: 30 tablet, Rfl: 0   SUMAtriptan (IMITREX) 100 MG tablet, TAKE 1 TABLET BY MOUTH ONCE AS NEEDED FOR MIGRAINE. MAY TAKE SECOND DOSE AFTER 2 HRS IF NEEDED, Disp: , Rfl:  0  Social History   Tobacco Use  Smoking Status Never  Smokeless Tobacco Never    No Known Allergies Objective:  There were no vitals filed for this visit. There is no height or weight on file to calculate BMI. Constitutional Well developed. Well nourished.  Vascular Foot warm and well perfused. Capillary refill normal to all digits.   Neurologic Normal speech. Oriented to person, place, and time. Epicritic sensation to light touch grossly present bilaterally.  Dermatologic Skin healing well without signs of infection. Skin edges well coapted without signs of infection.  Orthopedic: Tenderness to palpation noted about the surgical site.   Radiographs: None Assessment:   1. Peroneal tendinitis of right lower extremity   2. Status post foot surgery    Plan:  Patient was evaluated and treated and all questions answered.  S/p foot surgery right -Progressing as expected post-operatively. -XR: None -WB Status: Nonweightbearing to the right lower extremity knee scooter and cam boot -Sutures: Intact.  No clinical signs of dehiscence noted no complication noted. -Medications: None -Foot redressed.  No follow-ups on file.

## 2021-01-02 ENCOUNTER — Other Ambulatory Visit: Payer: Self-pay

## 2021-01-02 ENCOUNTER — Encounter: Payer: Self-pay | Admitting: Podiatry

## 2021-01-02 ENCOUNTER — Ambulatory Visit (INDEPENDENT_AMBULATORY_CARE_PROVIDER_SITE_OTHER): Payer: BC Managed Care – PPO | Admitting: Podiatry

## 2021-01-02 DIAGNOSIS — M7671 Peroneal tendinitis, right leg: Secondary | ICD-10-CM

## 2021-01-02 DIAGNOSIS — Z9889 Other specified postprocedural states: Secondary | ICD-10-CM

## 2021-01-02 NOTE — Telephone Encounter (Signed)
Please advise 

## 2021-01-03 ENCOUNTER — Telehealth: Payer: Self-pay | Admitting: *Deleted

## 2021-01-03 NOTE — Telephone Encounter (Signed)
"  I'm a patient of Dr. Allena Katz.  I have a couple of questions about my visit from yesterday I'd like to ask him about.  I'm still on a knee scooter.  Can I get a handicap parking pass?  Please return my call."

## 2021-01-04 NOTE — Progress Notes (Signed)
°  Subjective:  Patient ID: Sue Cook, female    DOB: 1980/07/01,  MRN: [id number]  Chief Complaint  Patient presents with   Routine Post Op     POV #2 DOS 12/11/2020 RT PERONEAL TENDON REPAIR W/TENOSYNOVECTOMY    DOS: 12/11/2020 Procedure: Right peroneal tendon repair with tenosynovectomy  40 y.o. female returns for post-op check.  Patient states that she is doing well.  She is controlling her pain with ibuprofen only.  She has been nonweightbearing to the right lower extremity with knee scooter.  Bandages clean dry and intact.  Review of Systems: Negative except as noted in the HPI. Denies N/V/F/Ch.  Past Medical History:  Diagnosis Date   Ankle pain 05/2016   RIGHT-PT SEEING DR Ether Griffins AND GETTING CORTISONE INJECTIONS   Complication of anesthesia    GERD (gastroesophageal reflux disease)    rare-NO MEDS   Headache    MIGRAINES   PONV (postoperative nausea and vomiting)     Current Outpatient Medications:    cyclobenzaprine (FLEXERIL) 10 MG tablet, Take by mouth., Disp: , Rfl:    diclofenac (VOLTAREN) 75 MG EC tablet, TAKE 1 TABLET BY MOUTH TWICE A DAY (Patient taking differently: as needed.), Disp: 60 tablet, Rfl: 3   ibuprofen (ADVIL) 800 MG tablet, Take 1 tablet (800 mg total) by mouth every 6 (six) hours as needed., Disp: 60 tablet, Rfl: 1   ibuprofen (ADVIL,MOTRIN) 200 MG tablet, Take 600 mg by mouth every 6 (six) hours as needed for mild pain., Disp: , Rfl:    meloxicam (MOBIC) 15 MG tablet, TAKE 1 TABLET BY MOUTH EVERY DAY (Patient not taking: No sig reported), Disp: , Rfl:    naproxen (NAPROSYN) 500 MG tablet, Take 500 mg by mouth 2 (two) times daily with a meal., Disp: , Rfl:    oxyCODONE-acetaminophen (PERCOCET) 5-325 MG tablet, Take 1 tablet by mouth every 4 (four) hours as needed for severe pain., Disp: 30 tablet, Rfl: 0   SUMAtriptan (IMITREX) 100 MG tablet, TAKE 1 TABLET BY MOUTH ONCE AS NEEDED FOR MIGRAINE. MAY TAKE SECOND DOSE AFTER 2 HRS IF NEEDED, Disp: ,  Rfl: 0  Social History   Tobacco Use  Smoking Status Never  Smokeless Tobacco Never    No Known Allergies Objective:  There were no vitals filed for this visit. There is no height or weight on file to calculate BMI. Constitutional Well developed. Well nourished.  Vascular Foot warm and well perfused. Capillary refill normal to all digits.   Neurologic Normal speech. Oriented to person, place, and time. Epicritic sensation to light touch grossly present bilaterally.  Dermatologic Skin completely epithelialized.  No dehiscence noted.  Good correction alignment noted.  Manual muscle strength is 4 out of 5.  Orthopedic: Tenderness to palpation noted about the surgical site.   Radiographs: None Assessment:   1. Peroneal tendinitis of right lower extremity   2. Status post foot surgery     Plan:  Patient was evaluated and treated and all questions answered.  S/p foot surgery right -Progressing as expected post-operatively. -XR: None -WB Status: Nonweightbearing to the right lower extremity knee scooter and cam boot -Sutures: Removed no clinical signs of dehiscence noted no complication noted. -Medications: None -Begin physical therapy and start transition to weightbearing as tolerated in a boot in 2 weeks.  She states understanding  No follow-ups on file.

## 2021-01-09 ENCOUNTER — Encounter: Payer: BC Managed Care – PPO | Admitting: Podiatry

## 2021-01-09 ENCOUNTER — Ambulatory Visit: Payer: BC Managed Care – PPO | Admitting: Podiatry

## 2021-01-17 ENCOUNTER — Telehealth: Payer: Self-pay | Admitting: *Deleted

## 2021-01-17 ENCOUNTER — Encounter: Payer: Self-pay | Admitting: Podiatry

## 2021-01-17 NOTE — Telephone Encounter (Signed)
"  I'm a patient of Dr. Allena Katz.  I'm currently in physical therapy.  They were wondering when I can become weight bearing as tolerated to come off the knee scooter.  I also have a question about when I can go back to work.  If someone could, give me a call."

## 2021-01-19 ENCOUNTER — Encounter: Payer: BC Managed Care – PPO | Admitting: Certified Nurse Midwife

## 2021-01-19 ENCOUNTER — Encounter: Payer: Self-pay | Admitting: Podiatry

## 2021-01-25 ENCOUNTER — Encounter: Payer: Self-pay | Admitting: Podiatry

## 2021-02-01 ENCOUNTER — Encounter: Payer: Self-pay | Admitting: Podiatry

## 2021-02-01 ENCOUNTER — Ambulatory Visit (INDEPENDENT_AMBULATORY_CARE_PROVIDER_SITE_OTHER): Payer: BC Managed Care – PPO | Admitting: Podiatry

## 2021-02-01 ENCOUNTER — Other Ambulatory Visit: Payer: Self-pay

## 2021-02-01 DIAGNOSIS — M7671 Peroneal tendinitis, right leg: Secondary | ICD-10-CM

## 2021-02-01 DIAGNOSIS — Z9889 Other specified postprocedural states: Secondary | ICD-10-CM

## 2021-02-01 NOTE — Progress Notes (Signed)
Subjective:  Patient ID: Sue Cook, female    DOB: [birth year],  MRN: [id number]  Chief Complaint  Patient presents with   Routine Post Op    "It's doing okay.  It's still weak and sore.  Physical therapy is going good."    DOS: 12/11/2020 Procedure: Right peroneal tendon repair with tenosynovectomy  41 y.o. female returns for post-op check.  Patient states that she is doing well.  Pain is tolerable.  She is transition between regular shoes and cam boot.  She is doing overall better.  She denies any other acute complaints.  Review of Systems: Negative except as noted in the HPI. Denies N/V/F/Ch.  Past Medical History:  Diagnosis Date   Ankle pain 05/2016   RIGHT-PT SEEING DR Ether Griffins AND GETTING CORTISONE INJECTIONS   Complication of anesthesia    GERD (gastroesophageal reflux disease)    rare-NO MEDS   Headache    MIGRAINES   PONV (postoperative nausea and vomiting)     Current Outpatient Medications:    cyclobenzaprine (FLEXERIL) 10 MG tablet, Take by mouth. (Patient not taking: Reported on 02/01/2021), Disp: , Rfl:    diclofenac (VOLTAREN) 75 MG EC tablet, TAKE 1 TABLET BY MOUTH TWICE A DAY (Patient not taking: Reported on 02/01/2021), Disp: 60 tablet, Rfl: 3   ibuprofen (ADVIL) 800 MG tablet, Take 1 tablet (800 mg total) by mouth every 6 (six) hours as needed. (Patient not taking: Reported on 02/01/2021), Disp: 60 tablet, Rfl: 1   ibuprofen (ADVIL,MOTRIN) 200 MG tablet, Take 600 mg by mouth every 6 (six) hours as needed for mild pain. (Patient not taking: Reported on 02/01/2021), Disp: , Rfl:    meloxicam (MOBIC) 15 MG tablet, TAKE 1 TABLET BY MOUTH EVERY DAY (Patient not taking: No sig reported), Disp: , Rfl:    naproxen (NAPROSYN) 500 MG tablet, Take 500 mg by mouth 2 (two) times daily with a meal. (Patient not taking: Reported on 02/01/2021), Disp: , Rfl:    oxyCODONE-acetaminophen (PERCOCET) 5-325 MG tablet, Take 1 tablet by mouth every 4 (four) hours as needed for severe  pain. (Patient not taking: Reported on 02/01/2021), Disp: 30 tablet, Rfl: 0   SUMAtriptan (IMITREX) 100 MG tablet, TAKE 1 TABLET BY MOUTH ONCE AS NEEDED FOR MIGRAINE. MAY TAKE SECOND DOSE AFTER 2 HRS IF NEEDED (Patient not taking: Reported on 02/01/2021), Disp: , Rfl: 0  Social History   Tobacco Use  Smoking Status Never  Smokeless Tobacco Never    No Known Allergies Objective:  There were no vitals filed for this visit. There is no height or weight on file to calculate BMI. Constitutional Well developed. Well nourished.  Vascular Foot warm and well perfused. Capillary refill normal to all digits.   Neurologic Normal speech. Oriented to person, place, and time. Epicritic sensation to light touch grossly present bilaterally.  Dermatologic Skin completely epithelialized.  No dehiscence noted.  Good correction alignment noted.  Manual muscle strength is 4 out of 5.  Orthopedic: Mild tenderness to palpation noted about the surgical site.   Radiographs: None Assessment:   1. Peroneal tendinitis of right lower extremity   2. Status post foot surgery      Plan:  Patient was evaluated and treated and all questions answered.  S/p foot surgery right -Progressing as expected post-operatively. -XR: None -WB Status: Weightbearing as tolerated in regular shoes -Sutures: Removed no clinical signs of dehiscence noted no complication noted. -Medications: None -Continue physical therapy to increase the strength of the peroneal tendons. -  I will see her back in 4 weeks for final follow-up and discharge if everything is going okay  No follow-ups on file.

## 2021-03-06 ENCOUNTER — Ambulatory Visit (INDEPENDENT_AMBULATORY_CARE_PROVIDER_SITE_OTHER): Payer: BC Managed Care – PPO | Admitting: Podiatry

## 2021-03-06 ENCOUNTER — Other Ambulatory Visit: Payer: Self-pay

## 2021-03-06 ENCOUNTER — Encounter: Payer: Self-pay | Admitting: *Deleted

## 2021-03-06 DIAGNOSIS — Z9889 Other specified postprocedural states: Secondary | ICD-10-CM

## 2021-03-06 DIAGNOSIS — M7671 Peroneal tendinitis, right leg: Secondary | ICD-10-CM

## 2021-03-09 ENCOUNTER — Other Ambulatory Visit: Payer: Self-pay | Admitting: Podiatry

## 2021-03-09 ENCOUNTER — Encounter: Payer: Self-pay | Admitting: Podiatry

## 2021-03-09 MED ORDER — METHYLPREDNISOLONE 4 MG PO TBPK: ORAL_TABLET | ORAL | 0 refills | Status: DC

## 2021-03-09 NOTE — Progress Notes (Signed)
Subjective:  Patient ID: Sue Cook, female    DOB: 05-17-1980,  MRN: [id number]  Chief Complaint  Patient presents with   Routine Post Op    Post op dos 11.21.22    DOS: 12/11/2020 Procedure: Right peroneal tendon repair with tenosynovectomy  41 y.o. female returns for post-op check.  Patient states that she is doing well.  Pain is tolerable.  She has been ambulating in regular shoes.  Once in a while she may put the boot on.  Otherwise she is doing okay.  Regular shoes and cam boot.  She is doing overall better.  She denies any other acute complaints.  Review of Systems: Negative except as noted in the HPI. Denies N/V/F/Ch.  Past Medical History:  Diagnosis Date   Ankle pain 05/2016   RIGHT-PT SEEING DR Ether Griffins AND GETTING CORTISONE INJECTIONS   Complication of anesthesia    GERD (gastroesophageal reflux disease)    rare-NO MEDS   Headache    MIGRAINES   PONV (postoperative nausea and vomiting)     Current Outpatient Medications:    cyclobenzaprine (FLEXERIL) 10 MG tablet, Take by mouth. (Patient not taking: Reported on 02/01/2021), Disp: , Rfl:    diclofenac (VOLTAREN) 75 MG EC tablet, TAKE 1 TABLET BY MOUTH TWICE A DAY (Patient not taking: Reported on 02/01/2021), Disp: 60 tablet, Rfl: 3   ibuprofen (ADVIL) 800 MG tablet, Take 1 tablet (800 mg total) by mouth every 6 (six) hours as needed. (Patient not taking: Reported on 02/01/2021), Disp: 60 tablet, Rfl: 1   ibuprofen (ADVIL,MOTRIN) 200 MG tablet, Take 600 mg by mouth every 6 (six) hours as needed for mild pain. (Patient not taking: Reported on 02/01/2021), Disp: , Rfl:    meloxicam (MOBIC) 15 MG tablet, TAKE 1 TABLET BY MOUTH EVERY DAY (Patient not taking: No sig reported), Disp: , Rfl:    methylPREDNISolone (MEDROL DOSEPAK) 4 MG TBPK tablet, Use as directed, Disp: 1 each, Rfl: 0   naproxen (NAPROSYN) 500 MG tablet, Take 500 mg by mouth 2 (two) times daily with a meal. (Patient not taking: Reported on 02/01/2021), Disp: ,  Rfl:    oxyCODONE-acetaminophen (PERCOCET) 5-325 MG tablet, Take 1 tablet by mouth every 4 (four) hours as needed for severe pain. (Patient not taking: Reported on 02/01/2021), Disp: 30 tablet, Rfl: 0   SUMAtriptan (IMITREX) 100 MG tablet, TAKE 1 TABLET BY MOUTH ONCE AS NEEDED FOR MIGRAINE. MAY TAKE SECOND DOSE AFTER 2 HRS IF NEEDED (Patient not taking: Reported on 02/01/2021), Disp: , Rfl: 0  Social History   Tobacco Use  Smoking Status Never  Smokeless Tobacco Never    No Known Allergies Objective:  There were no vitals filed for this visit. There is no height or weight on file to calculate BMI. Constitutional Well developed. Well nourished.  Vascular Foot warm and well perfused. Capillary refill normal to all digits.   Neurologic Normal speech. Oriented to person, place, and time. Epicritic sensation to light touch grossly present bilaterally.  Dermatologic Skin completely epithelialized.  No dehiscence noted.  Good correction alignment noted.  Manual muscle strength is 4 out of 5.  Orthopedic: Mild tenderness to palpation noted about the surgical site.   Radiographs: None Assessment:   1. Peroneal tendinitis of right lower extremity   2. Status post foot surgery       Plan:  Patient was evaluated and treated and all questions answered.  S/p foot surgery right -Progressing as expected post-operatively. -XR: None -WB Status: Weightbearing as  tolerated in regular shoes -Sutures: Removed no clinical signs of dehiscence noted no complication noted. -Medications: None -Continue physical therapy -Patient is officially discharged from my care she can return to work in a limited fashion followed by full work.  No follow-ups on file.

## 2021-03-15 ENCOUNTER — Encounter: Payer: Self-pay | Admitting: Podiatry

## 2021-03-15 ENCOUNTER — Ambulatory Visit (INDEPENDENT_AMBULATORY_CARE_PROVIDER_SITE_OTHER): Payer: BC Managed Care – PPO | Admitting: Podiatry

## 2021-03-15 ENCOUNTER — Other Ambulatory Visit: Payer: Self-pay

## 2021-03-15 DIAGNOSIS — M62838 Other muscle spasm: Secondary | ICD-10-CM

## 2021-03-15 DIAGNOSIS — M7671 Peroneal tendinitis, right leg: Secondary | ICD-10-CM | POA: Diagnosis not present

## 2021-03-15 MED ORDER — GABAPENTIN 100 MG PO CAPS: 100.0000 mg | ORAL_CAPSULE | Freq: Three times a day (TID) | ORAL | 3 refills | Status: DC

## 2021-03-19 ENCOUNTER — Encounter: Payer: Self-pay | Admitting: Podiatry

## 2021-03-20 NOTE — Progress Notes (Signed)
Subjective:  Patient ID: Sue Cook, female    DOB: [birth year],  MRN: [id number]  Chief Complaint  Patient presents with   Post-op Problem    "It flared up just like before surgery, then I couldn't walk on it.  I had to miss work.  I have papers again."    DOS: 12/11/2020 Procedure: Right peroneal tendon repair with tenosynovectomy  41 y.o. female returns for post-op check.  Patient states that she is doing well.  She states that she was doing well.  However the pain started locking up like it was before the surgery.  Her foot locked up again.  She states started to hurt again.  She would like to know if she can do intermittent FMLA  Review of Systems: Negative except as noted in the HPI. Denies N/V/F/Ch.  Past Medical History:  Diagnosis Date   Ankle pain 05/2016   RIGHT-PT SEEING DR Vickki Muff AND GETTING CORTISONE INJECTIONS   Complication of anesthesia    GERD (gastroesophageal reflux disease)    rare-NO MEDS   Headache    MIGRAINES   PONV (postoperative nausea and vomiting)     Current Outpatient Medications:    gabapentin (NEURONTIN) 100 MG capsule, Take 1 capsule (100 mg total) by mouth 3 (three) times daily., Disp: 90 capsule, Rfl: 3   cyclobenzaprine (FLEXERIL) 10 MG tablet, Take by mouth. (Patient not taking: Reported on 02/01/2021), Disp: , Rfl:    diclofenac (VOLTAREN) 75 MG EC tablet, TAKE 1 TABLET BY MOUTH TWICE A DAY (Patient not taking: Reported on 02/01/2021), Disp: 60 tablet, Rfl: 3   ibuprofen (ADVIL) 800 MG tablet, Take 1 tablet (800 mg total) by mouth every 6 (six) hours as needed. (Patient not taking: Reported on 02/01/2021), Disp: 60 tablet, Rfl: 1   ibuprofen (ADVIL,MOTRIN) 200 MG tablet, Take 600 mg by mouth every 6 (six) hours as needed for mild pain. (Patient not taking: Reported on 02/01/2021), Disp: , Rfl:    meloxicam (MOBIC) 15 MG tablet, TAKE 1 TABLET BY MOUTH EVERY DAY (Patient not taking: No sig reported), Disp: , Rfl:    methylPREDNISolone (MEDROL  DOSEPAK) 4 MG TBPK tablet, Use as directed, Disp: 1 each, Rfl: 0   naproxen (NAPROSYN) 500 MG tablet, Take 500 mg by mouth 2 (two) times daily with a meal. (Patient not taking: Reported on 02/01/2021), Disp: , Rfl:    oxyCODONE-acetaminophen (PERCOCET) 5-325 MG tablet, Take 1 tablet by mouth every 4 (four) hours as needed for severe pain. (Patient not taking: Reported on 02/01/2021), Disp: 30 tablet, Rfl: 0   SUMAtriptan (IMITREX) 100 MG tablet, TAKE 1 TABLET BY MOUTH ONCE AS NEEDED FOR MIGRAINE. MAY TAKE SECOND DOSE AFTER 2 HRS IF NEEDED (Patient not taking: Reported on 02/01/2021), Disp: , Rfl: 0  Social History   Tobacco Use  Smoking Status Never   Passive exposure: Never  Smokeless Tobacco Never    No Known Allergies Objective:  There were no vitals filed for this visit. There is no height or weight on file to calculate BMI. Constitutional Well developed. Well nourished.  Vascular Foot warm and well perfused. Capillary refill normal to all digits.   Neurologic Normal speech. Oriented to person, place, and time. Epicritic sensation to light touch grossly present bilaterally.  Dermatologic Skin completely epithelialized.  No dehiscence noted.  Good correction alignment noted.  Manual muscle strength is 4 out of 5.  No pain along the course of the incision.  Orthopedic: Mild tenderness to palpation noted about the  surgical site.   Radiographs: None Assessment:   1. Peroneal tendinitis of right lower extremity   2. Muscle spasm        Plan:  Patient was evaluated and treated and all questions answered.  S/p foot surgery right -Progressing as expected post-operatively. -XR: None -WB Status: Weightbearing as tolerated in regular shoes -Sutures: Removed no clinical signs of dehiscence noted no complication noted. -Medications: None -Continue physical therapy -Since patient has returned to work.  Flared up the right foot and started locking up again.  She would like to know if  she can do intermittent FMLA when it gets really sore. -I have added gabapentin to help with the pain part of it.  I discussed with her to continue taking meloxicam.  No follow-ups on file.

## 2021-03-23 ENCOUNTER — Encounter: Payer: Self-pay | Admitting: Podiatry

## 2021-03-27 ENCOUNTER — Telehealth: Payer: Self-pay | Admitting: Podiatry

## 2021-03-27 ENCOUNTER — Other Ambulatory Visit: Payer: Self-pay | Admitting: Podiatry

## 2021-03-27 ENCOUNTER — Encounter: Payer: Self-pay | Admitting: Podiatry

## 2021-03-27 DIAGNOSIS — M62838 Other muscle spasm: Secondary | ICD-10-CM

## 2021-03-27 NOTE — Telephone Encounter (Signed)
Patient came in today asking for an updated note for work. Patient states she wanted it to say she can lift certain amount of punds. I told pt I would reach out to Dr Allena Katz first Marcine Matar ma sure he is ok with her lifting before writing the note. ?

## 2021-03-28 ENCOUNTER — Encounter: Payer: Self-pay | Admitting: Podiatry

## 2021-03-28 NOTE — Telephone Encounter (Signed)
Called pt let her know you were ok with the note and lifting.  ?

## 2021-03-29 ENCOUNTER — Telehealth: Payer: Self-pay | Admitting: Podiatry

## 2021-03-29 NOTE — Telephone Encounter (Signed)
Alex called wanting to know the end date pt's restrictions. I let her know her next scheduled appt back with [place].  ?

## 2021-04-03 LAB — BASIC METABOLIC PANEL
BUN/Creatinine Ratio: 22 (ref 9–23)
BUN: 18 mg/dL (ref 6–24)
CO2: 24 mmol/L (ref 20–29)
Calcium: 9.4 mg/dL (ref 8.7–10.2)
Chloride: 101 mmol/L (ref 96–106)
Creatinine, Ser: 0.82 mg/dL (ref 0.57–1.00)
Glucose: 113 mg/dL — ABNORMAL HIGH (ref 70–99)
Potassium: 4.4 mmol/L (ref 3.5–5.2)
Sodium: 141 mmol/L (ref 134–144)
eGFR: 92 mL/min/{1.73_m2} (ref >59)

## 2021-04-03 LAB — CBC WITH DIFFERENTIAL/PLATELET
Basophils Absolute: 0 10*3/uL (ref 0.0–0.2)
Basos: 1 %
EOS (ABSOLUTE): 0.1 10*3/uL (ref 0.0–0.4)
Eos: 1 %
Hematocrit: 40 % (ref 34.0–46.6)
Hemoglobin: 13.9 g/dL (ref 11.1–15.9)
Immature Grans (Abs): 0 10*3/uL (ref 0.0–0.1)
Immature Granulocytes: 0 %
Lymphocytes Absolute: 1.9 10*3/uL (ref 0.7–3.1)
Lymphs: 33 %
MCH: 30.5 pg (ref 26.6–33.0)
MCHC: 34.8 g/dL (ref 31.5–35.7)
MCV: 88 fL (ref 79–97)
Monocytes Absolute: 0.3 10*3/uL (ref 0.1–0.9)
Monocytes: 5 %
Neutrophils Absolute: 3.5 10*3/uL (ref 1.4–7.0)
Neutrophils: 60 %
Platelets: 242 10*3/uL (ref 150–450)
RBC: 4.56 x10E6/uL (ref 3.77–5.28)
RDW: 12.4 % (ref 11.7–15.4)
WBC: 5.8 10*3/uL (ref 3.4–10.8)

## 2021-04-03 LAB — ANA: ANA Titer 1: NEGATIVE

## 2021-04-03 LAB — VITAMIN D 25 HYDROXY (VIT D DEFICIENCY, FRACTURES): Vit D, 25-Hydroxy: 34.5 ng/mL (ref 30.0–100.0)

## 2021-04-03 LAB — RHEUMATOID ARTHRITIS PROFILE
Cyclic Citrullin Peptide Ab: <1 units (ref 0–19)
Rheumatoid fact SerPl-aCnc: 10.6 IU/mL (ref <14.0)

## 2021-04-03 LAB — VITAMIN B12: Vitamin B-12: 196 pg/mL — ABNORMAL LOW (ref 232–1245)

## 2021-04-03 LAB — MAGNESIUM: Magnesium: 2.1 mg/dL (ref 1.6–2.3)

## 2021-04-05 ENCOUNTER — Encounter: Payer: Self-pay | Admitting: Podiatry

## 2021-04-06 ENCOUNTER — Other Ambulatory Visit: Payer: Self-pay | Admitting: Podiatry

## 2021-04-06 DIAGNOSIS — G709 Myoneural disorder, unspecified: Secondary | ICD-10-CM

## 2021-04-11 ENCOUNTER — Encounter: Payer: Self-pay | Admitting: Podiatry

## 2021-04-13 ENCOUNTER — Encounter: Payer: Self-pay | Admitting: Podiatry

## 2021-04-13 ENCOUNTER — Telehealth: Payer: Self-pay | Admitting: Podiatry

## 2021-04-13 NOTE — Telephone Encounter (Signed)
Sue Cook is asking to be taken out of work on Short term Disability, due to increased pain after being on feet working. Patient would like to be referred to [facility] neurologist ?

## 2021-04-13 NOTE — Telephone Encounter (Signed)
Faxed and called the referral/notes/imaging/labs to [facility], received confirmation-04/13/21

## 2021-04-16 ENCOUNTER — Encounter: Payer: Self-pay | Admitting: Podiatry

## 2021-04-19 ENCOUNTER — Telehealth: Payer: Self-pay | Admitting: *Deleted

## 2021-04-19 NOTE — Telephone Encounter (Signed)
Patient has been scheduled for 07/13/21 @12 :45

## 2021-04-19 NOTE — Telephone Encounter (Signed)
Patient has been scheduled for appointment w/ [facility] ,06/22/21@12 :45. ?

## 2021-04-26 ENCOUNTER — Ambulatory Visit (INDEPENDENT_AMBULATORY_CARE_PROVIDER_SITE_OTHER): Payer: BC Managed Care – PPO | Admitting: Podiatry

## 2021-04-26 DIAGNOSIS — M7671 Peroneal tendinitis, right leg: Secondary | ICD-10-CM | POA: Diagnosis not present

## 2021-04-26 DIAGNOSIS — M62838 Other muscle spasm: Secondary | ICD-10-CM

## 2021-04-26 DIAGNOSIS — R2689 Other abnormalities of gait and mobility: Secondary | ICD-10-CM

## 2021-04-26 DIAGNOSIS — G709 Myoneural disorder, unspecified: Secondary | ICD-10-CM

## 2021-05-01 ENCOUNTER — Encounter: Payer: Self-pay | Admitting: Podiatry

## 2021-05-01 NOTE — Progress Notes (Addendum)
Subjective:  Patient ID: Sue Cook, female    DOB: 12-28-1980,  MRN: [id number]  Chief Complaint  Patient presents with   Post-op Problem    DOS: 12/11/2020 Procedure: Right peroneal tendon repair with tenosynovectomy  41 y.o. female presents with pain secondary to foot locking up.  She states that it is about the same she still more muscle spasm in the foot locking up.  The pain from that is excruciating.  She would like to discuss next treatment plans  Review of Systems: Negative except as noted in the HPI. Denies N/V/F/Ch.  Past Medical History:  Diagnosis Date   Ankle pain 05/2016   RIGHT-PT SEEING DR Vickki Muff AND GETTING CORTISONE INJECTIONS   Complication of anesthesia    GERD (gastroesophageal reflux disease)    rare-NO MEDS   Headache    MIGRAINES   PONV (postoperative nausea and vomiting)     Current Outpatient Medications:    cyclobenzaprine (FLEXERIL) 10 MG tablet, Take by mouth. (Patient not taking: Reported on 02/01/2021), Disp: , Rfl:    diclofenac (VOLTAREN) 75 MG EC tablet, TAKE 1 TABLET BY MOUTH TWICE A DAY (Patient not taking: Reported on 02/01/2021), Disp: 60 tablet, Rfl: 3   gabapentin (NEURONTIN) 100 MG capsule, Take 1 capsule (100 mg total) by mouth 3 (three) times daily., Disp: 90 capsule, Rfl: 3   ibuprofen (ADVIL) 800 MG tablet, Take 1 tablet (800 mg total) by mouth every 6 (six) hours as needed. (Patient not taking: Reported on 02/01/2021), Disp: 60 tablet, Rfl: 1   ibuprofen (ADVIL,MOTRIN) 200 MG tablet, Take 600 mg by mouth every 6 (six) hours as needed for mild pain. (Patient not taking: Reported on 02/01/2021), Disp: , Rfl:    meloxicam (MOBIC) 15 MG tablet, TAKE 1 TABLET BY MOUTH EVERY DAY (Patient not taking: No sig reported), Disp: , Rfl:    methylPREDNISolone (MEDROL DOSEPAK) 4 MG TBPK tablet, Use as directed, Disp: 1 each, Rfl: 0   naproxen (NAPROSYN) 500 MG tablet, Take 500 mg by mouth 2 (two) times daily with a meal. (Patient not taking:  Reported on 02/01/2021), Disp: , Rfl:    oxyCODONE-acetaminophen (PERCOCET) 5-325 MG tablet, Take 1 tablet by mouth every 4 (four) hours as needed for severe pain. (Patient not taking: Reported on 02/01/2021), Disp: 30 tablet, Rfl: 0   SUMAtriptan (IMITREX) 100 MG tablet, TAKE 1 TABLET BY MOUTH ONCE AS NEEDED FOR MIGRAINE. MAY TAKE SECOND DOSE AFTER 2 HRS IF NEEDED (Patient not taking: Reported on 02/01/2021), Disp: , Rfl: 0  Social History   Tobacco Use  Smoking Status Never   Passive exposure: Never  Smokeless Tobacco Never    No Known Allergies Objective:  There were no vitals filed for this visit. There is no height or weight on file to calculate BMI. Constitutional Well developed. Well nourished.  Vascular Foot warm and well perfused. Capillary refill normal to all digits.   Neurologic Normal speech. Oriented to person, place, and time. Epicritic sensation to light touch grossly present bilaterally.  Dermatologic Skin completely epithelialized.  No dehiscence noted.  Good correction alignment noted.  Manual muscle strength is 4 out of 5.  No pain along the course of the incision.  Antalgic gait/instability during gait  Orthopedic: Mild tenderness to palpation noted about the surgical site.   Radiographs: None Assessment:   1. Neuromuscular disease ([facility])   2. Muscle spasm   3. Peroneal tendinitis of right lower extremity         Plan:  Patient was evaluated and treated and all questions answered.  S/p foot surgery right -Discontinue physical therapy as it is not helping her -She will benefit from a four-point cane for stability while ambulating. -I will await neurology follow-up in June.  We will attempt to see if he can get it moved up sooner. -I will extend FMLA for another 6 more weeks. -Continue gabapentin and meloxicam as needed -Handicap parking was given for 6 months  No follow-ups on file.

## 2021-05-08 ENCOUNTER — Telehealth: Payer: Self-pay | Admitting: Podiatry

## 2021-05-08 ENCOUNTER — Encounter: Payer: Self-pay | Admitting: Podiatry

## 2021-05-08 NOTE — Telephone Encounter (Signed)
Good Morning I spoke with Sue Cook her disability claim was denied. I called her disabilitiy company and they need to know..... ? ?What is her range of motion with her foot, is she able to move it? ?What is her pain level between 1-10? ?What are her limitations, as far as work? ? ? ?FYI: She stands on her feet all day long at work. ?

## 2021-05-10 ENCOUNTER — Encounter: Payer: Self-pay | Admitting: Podiatry

## 2021-06-12 ENCOUNTER — Ambulatory Visit: Payer: Managed Care, Other (non HMO) | Admitting: Podiatry

## 2021-06-12 DIAGNOSIS — G709 Myoneural disorder, unspecified: Secondary | ICD-10-CM

## 2021-06-12 DIAGNOSIS — M62838 Other muscle spasm: Secondary | ICD-10-CM | POA: Diagnosis not present

## 2021-06-12 NOTE — Progress Notes (Addendum)
Subjective:  Patient ID: Sue Cook, female    DOB: May 07, 1980,  MRN: [id number]  Chief Complaint  Patient presents with   Foot Pain    DOS: 12/11/2020 Procedure: Right peroneal tendon repair with tenosynovectomy  41 y.o. female presents with same complaint of foot being locked up.  She states she has a follow-up in June with neurology for which she is waiting for.  She has not had more than any improvement.  She would like to know if she can get a cane to help with ambulation.  Review of Systems: Negative except as noted in the HPI. Denies N/V/F/Ch.  Past Medical History:  Diagnosis Date   Ankle pain 05/2016   RIGHT-PT SEEING DR Vickki Muff AND GETTING CORTISONE INJECTIONS   Complication of anesthesia    GERD (gastroesophageal reflux disease)    rare-NO MEDS   Headache    MIGRAINES   PONV (postoperative nausea and vomiting)     Current Outpatient Medications:    cyclobenzaprine (FLEXERIL) 10 MG tablet, Take by mouth. (Patient not taking: Reported on 02/01/2021), Disp: , Rfl:    diclofenac (VOLTAREN) 75 MG EC tablet, TAKE 1 TABLET BY MOUTH TWICE A DAY (Patient not taking: Reported on 02/01/2021), Disp: 60 tablet, Rfl: 3   gabapentin (NEURONTIN) 100 MG capsule, Take 1 capsule (100 mg total) by mouth 3 (three) times daily., Disp: 90 capsule, Rfl: 3   ibuprofen (ADVIL) 800 MG tablet, Take 1 tablet (800 mg total) by mouth every 6 (six) hours as needed. (Patient not taking: Reported on 02/01/2021), Disp: 60 tablet, Rfl: 1   ibuprofen (ADVIL,MOTRIN) 200 MG tablet, Take 600 mg by mouth every 6 (six) hours as needed for mild pain. (Patient not taking: Reported on 02/01/2021), Disp: , Rfl:    meloxicam (MOBIC) 15 MG tablet, TAKE 1 TABLET BY MOUTH EVERY DAY (Patient not taking: No sig reported), Disp: , Rfl:    methylPREDNISolone (MEDROL DOSEPAK) 4 MG TBPK tablet, Use as directed, Disp: 1 each, Rfl: 0   naproxen (NAPROSYN) 500 MG tablet, Take 500 mg by mouth 2 (two) times daily with a meal.  (Patient not taking: Reported on 02/01/2021), Disp: , Rfl:    oxyCODONE-acetaminophen (PERCOCET) 5-325 MG tablet, Take 1 tablet by mouth every 4 (four) hours as needed for severe pain. (Patient not taking: Reported on 02/01/2021), Disp: 30 tablet, Rfl: 0   SUMAtriptan (IMITREX) 100 MG tablet, TAKE 1 TABLET BY MOUTH ONCE AS NEEDED FOR MIGRAINE. MAY TAKE SECOND DOSE AFTER 2 HRS IF NEEDED (Patient not taking: Reported on 02/01/2021), Disp: , Rfl: 0  Social History   Tobacco Use  Smoking Status Never   Passive exposure: Never  Smokeless Tobacco Never    No Known Allergies Objective:  There were no vitals filed for this visit. There is no height or weight on file to calculate BMI. Constitutional Well developed. Well nourished.  Vascular Foot warm and well perfused. Capillary refill normal to all digits.   Neurologic Normal speech. Oriented to person, place, and time. Epicritic sensation to light touch grossly present bilaterally.  Dermatologic Skin completely epithelialized.  No dehiscence noted.  Good correction alignment noted.  Manual muscle strength is 4 out of 5.  No pain along the course of the incision.  Antalgic gait/instability during gait  Orthopedic: Mild tenderness to palpation noted about the surgical site.   Radiographs: None Assessment:   No diagnosis found.       Plan:  Patient was evaluated and treated and all questions answered.  S/p foot surgery right -She will benefit from a four-point cane for stability while ambulating. -I will await neurology follow-up in June.  We will attempt to see if he can get it moved up sooner. -I will extend short-term disability further. -Continue gabapentin and meloxicam as needed -Handicap parking was given for 6 months  No follow-ups on file.

## 2021-06-21 ENCOUNTER — Encounter: Payer: Self-pay | Admitting: Podiatry

## 2021-06-21 ENCOUNTER — Telehealth: Payer: Self-pay | Admitting: Podiatry

## 2021-06-21 NOTE — Telephone Encounter (Signed)
Patient called because disability said the office notes were identical to the previous visit. The notes for 4/6 and 5/23 visit. Disability said that would need to be changed before going to the review board. Thanks

## 2021-08-07 ENCOUNTER — Ambulatory Visit: Payer: Managed Care, Other (non HMO) | Admitting: Podiatry

## 2021-08-07 ENCOUNTER — Encounter: Payer: Self-pay | Admitting: Podiatry

## 2021-08-07 DIAGNOSIS — G90521 Complex regional pain syndrome I of right lower limb: Secondary | ICD-10-CM

## 2021-08-07 NOTE — Progress Notes (Signed)
Subjective:  Patient ID: Sue Cook, female    DOB: 12-28-1980,  MRN: [id number]  Chief Complaint  Patient presents with   Foot Pain    "I wish I could figure out what is going on, it's frustrating."    DOS: 12/11/2020 Procedure: Right peroneal tendon repair with tenosynovectomy  41 y.o. female presents with same complaint of foot being locked up.  S he states that she had a follow-up with neurology which ruled out neuromuscular disease.  She states that they were thinking it could also be CRPS. Review of Systems: Negative except as noted in the HPI. Denies N/V/F/Ch.  Past Medical History:  Diagnosis Date   Ankle pain 05/2016   RIGHT-PT SEEING DR Ether Griffins AND GETTING CORTISONE INJECTIONS   Complication of anesthesia    GERD (gastroesophageal reflux disease)    rare-NO MEDS   Headache    MIGRAINES   PONV (postoperative nausea and vomiting)     Current Outpatient Medications:    cyclobenzaprine (FLEXERIL) 10 MG tablet, Take by mouth. (Patient not taking: Reported on 02/01/2021), Disp: , Rfl:    diclofenac (VOLTAREN) 75 MG EC tablet, TAKE 1 TABLET BY MOUTH TWICE A DAY (Patient not taking: Reported on 02/01/2021), Disp: 60 tablet, Rfl: 3   gabapentin (NEURONTIN) 100 MG capsule, Take 1 capsule (100 mg total) by mouth 3 (three) times daily., Disp: 90 capsule, Rfl: 3   ibuprofen (ADVIL) 800 MG tablet, Take 1 tablet (800 mg total) by mouth every 6 (six) hours as needed. (Patient not taking: Reported on 02/01/2021), Disp: 60 tablet, Rfl: 1   ibuprofen (ADVIL,MOTRIN) 200 MG tablet, Take 600 mg by mouth every 6 (six) hours as needed for mild pain. (Patient not taking: Reported on 02/01/2021), Disp: , Rfl:    meloxicam (MOBIC) 15 MG tablet, TAKE 1 TABLET BY MOUTH EVERY DAY (Patient not taking: No sig reported), Disp: , Rfl:    methylPREDNISolone (MEDROL DOSEPAK) 4 MG TBPK tablet, Use as directed, Disp: 1 each, Rfl: 0   naproxen (NAPROSYN) 500 MG tablet, Take 500 mg by mouth 2 (two) times daily  with a meal. (Patient not taking: Reported on 02/01/2021), Disp: , Rfl:    oxyCODONE-acetaminophen (PERCOCET) 5-325 MG tablet, Take 1 tablet by mouth every 4 (four) hours as needed for severe pain. (Patient not taking: Reported on 02/01/2021), Disp: 30 tablet, Rfl: 0   SUMAtriptan (IMITREX) 100 MG tablet, TAKE 1 TABLET BY MOUTH ONCE AS NEEDED FOR MIGRAINE. MAY TAKE SECOND DOSE AFTER 2 HRS IF NEEDED (Patient not taking: Reported on 02/01/2021), Disp: , Rfl: 0  Social History   Tobacco Use  Smoking Status Never   Passive exposure: Never  Smokeless Tobacco Never    No Known Allergies Objective:  There were no vitals filed for this visit. There is no height or weight on file to calculate BMI. Constitutional Well developed. Well nourished.  Vascular Foot warm and well perfused. Capillary refill normal to all digits.   Neurologic Normal speech. Oriented to person, place, and time. Epicritic sensation to light touch grossly present bilaterally.  Dermatologic No pain along the course of the surgical site/peroneal tendon.  Some weakness noted to the peroneal tendon.  Generalized pain in the leg and the lower extremity noted.  Unable to recreate the locked of the foot.  She  Orthopedic: Mild tenderness to palpation noted about the surgical site.   Radiographs: None Assessment:   1. Complex regional pain syndrome type 1 of right lower extremity  Plan:  Patient was evaluated and treated and all questions answered.  S/p foot surgery right -Clinically doing well from the surgical site standpoint however her foot is still hurting and it is very frustrating for her not to be able to figure out.  Right foot locking up/neuromuscular issues/CRPS -I explained the patient the etiology of neuromuscular disease/CRPS and worse treatment options were discussed. -She will benefit from physical therapy and I have really started the physical therapy -She will also benefit from pain management  referral for management of pain to the right leg -At this time neurology does not think it is neuromuscular an issue they are also thinking could be CRPS.  The nerve conduction study was within normal limits.  CRPS  No follow-ups on file.

## 2021-08-14 ENCOUNTER — Encounter: Payer: Self-pay | Admitting: Podiatry

## 2021-10-02 ENCOUNTER — Ambulatory Visit: Payer: Managed Care, Other (non HMO) | Admitting: Podiatry

## 2021-10-02 DIAGNOSIS — G709 Myoneural disorder, unspecified: Secondary | ICD-10-CM

## 2021-10-02 DIAGNOSIS — G90521 Complex regional pain syndrome I of right lower limb: Secondary | ICD-10-CM

## 2021-10-02 NOTE — Progress Notes (Signed)
Subjective:  Patient ID: Sue Cook, female    DOB: [birth year],  MRN: [id number]  Chief Complaint  Patient presents with   Foot Pain    Rt foot pain Pt stated that she feels like physical therapy has helped some she can tell a difference but is not better     DOS: 12/11/2020 Procedure: Right peroneal tendon repair with tenosynovectomy  41 y.o. female presents with same complaint of foot being locked up.  She states that she may have a component of CRPS as well.  She does not have any neurological issues per neurologist.  She is scheduled with pain medication doctor next week. Review of Systems: Negative except as noted in the HPI. Denies N/V/F/Ch.  Past Medical History:  Diagnosis Date   Ankle pain 05/2016   RIGHT-PT SEEING DR Ether Griffins AND GETTING CORTISONE INJECTIONS   Complication of anesthesia    GERD (gastroesophageal reflux disease)    rare-NO MEDS   Headache    MIGRAINES   PONV (postoperative nausea and vomiting)     Current Outpatient Medications:    cyclobenzaprine (FLEXERIL) 10 MG tablet, Take by mouth. (Patient not taking: Reported on 02/01/2021), Disp: , Rfl:    diclofenac (VOLTAREN) 75 MG EC tablet, TAKE 1 TABLET BY MOUTH TWICE A DAY (Patient not taking: Reported on 02/01/2021), Disp: 60 tablet, Rfl: 3   gabapentin (NEURONTIN) 100 MG capsule, Take 1 capsule (100 mg total) by mouth 3 (three) times daily., Disp: 90 capsule, Rfl: 3   ibuprofen (ADVIL) 800 MG tablet, Take 1 tablet (800 mg total) by mouth every 6 (six) hours as needed. (Patient not taking: Reported on 02/01/2021), Disp: 60 tablet, Rfl: 1   ibuprofen (ADVIL,MOTRIN) 200 MG tablet, Take 600 mg by mouth every 6 (six) hours as needed for mild pain. (Patient not taking: Reported on 02/01/2021), Disp: , Rfl:    meloxicam (MOBIC) 15 MG tablet, TAKE 1 TABLET BY MOUTH EVERY DAY (Patient not taking: No sig reported), Disp: , Rfl:    methylPREDNISolone (MEDROL DOSEPAK) 4 MG TBPK tablet, Use as directed, Disp: 1 each,  Rfl: 0   naproxen (NAPROSYN) 500 MG tablet, Take 500 mg by mouth 2 (two) times daily with a meal. (Patient not taking: Reported on 02/01/2021), Disp: , Rfl:    oxyCODONE-acetaminophen (PERCOCET) 5-325 MG tablet, Take 1 tablet by mouth every 4 (four) hours as needed for severe pain. (Patient not taking: Reported on 02/01/2021), Disp: 30 tablet, Rfl: 0   SUMAtriptan (IMITREX) 100 MG tablet, TAKE 1 TABLET BY MOUTH ONCE AS NEEDED FOR MIGRAINE. MAY TAKE SECOND DOSE AFTER 2 HRS IF NEEDED (Patient not taking: Reported on 02/01/2021), Disp: , Rfl: 0  Social History   Tobacco Use  Smoking Status Never   Passive exposure: Never  Smokeless Tobacco Never    No Known Allergies Objective:  There were no vitals filed for this visit. There is no height or weight on file to calculate BMI. Constitutional Well developed. Well nourished.  Vascular Foot warm and well perfused. Capillary refill normal to all digits.   Neurologic Normal speech. Oriented to person, place, and time. Epicritic sensation to light touch grossly present bilaterally.  Dermatologic No pain along the course of the surgical site/peroneal tendon.  Some weakness noted to the peroneal tendon.  Generalized pain in the leg and the lower extremity noted.  Unable to recreate the locked of the foot.  She  Orthopedic: Mild tenderness to palpation noted about the surgical site.   Radiographs: None Assessment:  1. Complex regional pain syndrome type 1 of right lower extremity   2. Neuromuscular disease (HCC)           Plan:  Patient was evaluated and treated and all questions answered.  S/p foot surgery right -Clinically doing well from the surgical site standpoint however her foot is still hurting and it is very frustrating for her not to be able to figure out.  Right foot locking up/neuromuscular issues/CRPS -I explained the patient the etiology of neuromuscular disease/CRPS and worse treatment options were discussed. -Physical  therapy -I will await in pain management evaluation and treatment -At this time neurology does not think it is neuromuscular an issue they are also thinking could be CRPS.  The nerve conduction study was within normal limits.  CRPS  No follow-ups on file.

## 2021-10-11 ENCOUNTER — Ambulatory Visit
Payer: Managed Care, Other (non HMO) | Attending: Student in an Organized Health Care Education/Training Program | Admitting: Student in an Organized Health Care Education/Training Program

## 2021-10-11 ENCOUNTER — Encounter: Payer: Self-pay | Admitting: Student in an Organized Health Care Education/Training Program

## 2021-10-11 VITALS — BP 153/84 | HR 71 | Temp 97.4°F | Resp 16 | Ht 65.0 in | Wt 219.0 lb

## 2021-10-11 DIAGNOSIS — G90521 Complex regional pain syndrome I of right lower limb: Secondary | ICD-10-CM | POA: Diagnosis present

## 2021-10-11 DIAGNOSIS — G894 Chronic pain syndrome: Secondary | ICD-10-CM | POA: Insufficient documentation

## 2021-10-11 NOTE — Progress Notes (Signed)
Patient: Sue Cook  Service Category: E/M  Provider: Gillis Santa, MD  DOB: 07-28-1980  DOS: 10/11/2021  Referring Provider: Felipa Furnace, DPM  MRN: [id number]  Setting: Ambulatory outpatient  PCP: Derinda Late, MD  Type: New Patient  Specialty: Interventional Pain Management    Location: Office  Delivery: Face-to-face     Primary Reason(s) for Visit: Encounter for initial evaluation of one or more chronic problems (new to examiner) potentially causing chronic pain, and posing a threat to normal musculoskeletal function. (Level of risk: High) CC: Foot Pain (Right foot and radiates up)  HPI  Sue Cook is a 41 y.o. year old, female patient, who comes for the first time to our practice referred by Felipa Furnace, DPM for our initial evaluation of her chronic pain. She has Carpal tunnel syndrome; Migraine without aura; BMI 45.0-49.9, adult ([place]); Family history of heart attack; Family history of breast cancer; Family history of cancer; Elevated LDL cholesterol level; Cervical prolapse; Complex regional pain syndrome type 1 of right lower extremity; and Chronic pain syndrome on their problem list. Today she comes in for evaluation of her Foot Pain (Right foot and radiates up)  Pain Assessment: Location: Right Foot Radiating: radiates upward Onset: More than a month ago Duration: Chronic pain Quality: Burning, Sharp, Spasm Severity: 3 /10 (subjective, self-reported pain score)  Effect on ADL: Limits activities Timing: Constant Modifying factors: nothing BP: (!) 153/84  HR: 71  Onset and Duration: Present longer than 3 months Cause of pain: Unknown Severity: NAS-11 at its worse: 10/10, NAS-11 at its best: 3/10, NAS-11 now: 4/10, and NAS-11 on the average: 7/10 Timing: Not influenced by the time of the day, During activity or exercise, After activity or exercise, and After a period of immobility Aggravating Factors: Bending, Climbing, Kneeling, Motion, Prolonged sitting, Prolonged  standing, Squatting, Twisting, Walking, Walking uphill, and Walking downhill Alleviating Factors:  none listed Associated Problems: Color changes, Day-time cramps, Night-time cramps, Inability to concentrate, Numbness, Spasms, Swelling, Temperature changes, Tingling, Weakness, Pain that wakes patient up, and Pain that does not allow patient to sleep Quality of Pain: Aching, Agonizing, Intermittent, Deep, Disabling, Exhausting, Horrible, Nagging, Sharp, Shooting, Throbbing, Tingling, and Work related Previous Examinations or Tests: CT scan, MRI scan, Nerve conduction test, and Neurological evaluation  Sue Cook is a pleasant 41 year old female who presents with a chief complaint of right foot pain believed to be related to complex regional pain syndrome.  She is being referred by Dr. Posey Pronto.  Of note she has a history of right foot surgery.  MRI of her right ankle showed mild osteoarthritis.  She states that her foot surgery was not helpful.  She has been having difficulty managing her pain.  She does endorse numbness and tingling, redness, difficulty with movement and excruciating pain at times of her right foot.  She has had cortisone injections in her right foot with limited response.  She is currently on short-term disability.  Of note her foot surgery was November 2022 in which Dr. Posey Pronto reportedly repaired a tendon, placed a graft and fixed the ligament.  She had recurrence of her symptoms thereafter.  She lost her job given inability to participate in work activities.  She has taken gabapentin in the past.  She has tried Effexor as well but had sedation and mood changes.  She has not tried Lyrica.  She is not interested in medication management per se.  I discussed interventional options with her for CRPS which include physical therapy, cognitive behavioral  therapy, tricyclic antidepressants, anticonvulsants including gabapentin Lyrica, lumbar sympathetic nerve block, spinal cord stimulation.  This was  discussed with the patient in great detail.  Risks of each therapeutic option was reviewed with patient and her husband.  They would like to proceed with lumbar sympathetic nerve block.  We will plan on doing this with sedation under fluoroscopy.  If this is not effective, we can consider spinal cord stimulation for CRPS.   Meds   Current Outpatient Medications:    cholecalciferol (VITAMIN D3) 25 MCG (1000 UNIT) tablet, Take 1,000 Units by mouth daily., Disp: , Rfl:    cyanocobalamin 2000 MCG tablet, Take 2,000 mcg by mouth daily., Disp: , Rfl:    Multiple Vitamin (MULTIVITAMIN WITH MINERALS) TABS tablet, Take 1 tablet by mouth daily., Disp: , Rfl:    SUMAtriptan (IMITREX) 100 MG tablet, , Disp: , Rfl: 0  Imaging Review   ROS  Cardiovascular: No reported cardiovascular signs or symptoms such as High blood pressure, coronary artery disease, abnormal heart rate or rhythm, heart attack, blood thinner therapy or heart weakness and/or failure Pulmonary or Respiratory: Snoring  Neurological: No reported neurological signs or symptoms such as seizures, abnormal skin sensations, urinary and/or fecal incontinence, being born with an abnormal open spine and/or a tethered spinal cord Psychological-Psychiatric: Difficulty sleeping and or falling asleep Gastrointestinal: Irregular, infrequent bowel movements (Constipation) Genitourinary: No reported renal or genitourinary signs or symptoms such as difficulty voiding or producing urine, peeing blood, non-functioning kidney, kidney stones, difficulty emptying the bladder, difficulty controlling the flow of urine, or chronic kidney disease Hematological: No reported hematological signs or symptoms such as prolonged bleeding, low or poor functioning platelets, bruising or bleeding easily, hereditary bleeding problems, low energy levels due to low hemoglobin or being anemic Endocrine: No reported endocrine signs or symptoms such as high or low blood sugar, rapid  heart rate due to high thyroid levels, obesity or weight gain due to slow thyroid or thyroid disease Rheumatologic: Joint aches and or swelling due to excess weight (Osteoarthritis) Musculoskeletal: Negative for myasthenia gravis, muscular dystrophy, multiple sclerosis or malignant hyperthermia Work History: Out on work excuse  Allergies  Sue Cook has No Known Allergies.  Laboratory Chemistry Profile   Renal Lab Results  Component Value Date   BUN 18 03/27/2021   CREATININE 0.82 03/27/2021   BCR 22 03/27/2021   GFRAA 113 02/11/2018   GFRNONAA 98 02/11/2018   PROTEINUR TRACE 07/17/2012     Electrolytes Lab Results  Component Value Date   NA 141 03/27/2021   K 4.4 03/27/2021   CL 101 03/27/2021   CALCIUM 9.4 03/27/2021   MG 2.1 03/27/2021     Hepatic Lab Results  Component Value Date   AST 12 02/11/2018   ALT 20 02/11/2018   ALBUMIN 4.7 02/11/2018   ALKPHOS 66 02/11/2018     ID Lab Results  Component Value Date   SARSCOV2NAA Not Detected 10/14/2018     Bone Lab Results  Component Value Date   VD25OH 34.5 03/27/2021     Endocrine Lab Results  Component Value Date   GLUCOSE 113 (H) 03/27/2021   GLUCOSEU NEGATIVE 07/17/2012   HGBA1C 4.9 02/11/2018   TSH 1.160 02/11/2018     Neuropathy Lab Results  Component Value Date   VITAMINB12 196 (L) 03/27/2021   HGBA1C 4.9 02/11/2018     CNS No results found for: "COLORCSF", "APPEARCSF", "RBCCOUNTCSF", "WBCCSF", "POLYSCSF", "LYMPHSCSF", "EOSCSF", "PROTEINCSF", "GLUCCSF", "JCVIRUS", "CSFOLI", "IGGCSF", "LABACHR", "ACETBL"   Inflammation (CRP: Acute  ESR: Chronic) No results found for: "CRP", "ESRSEDRATE", "LATICACIDVEN"   Rheumatology Lab Results  Component Value Date   RF 10.6 03/27/2021     Coagulation Lab Results  Component Value Date   PLT 242 03/27/2021     Cardiovascular Lab Results  Component Value Date   HGB 13.9 03/27/2021   HCT 40.0 03/27/2021     Screening Lab Results  Component  Value Date   SARSCOV2NAA Not Detected 10/14/2018     Cancer No results found for: "CEA", "CA125", "LABCA2"   Allergens No results found for: "ALMOND", "APPLE", "ASPARAGUS", "AVOCADO", "BANANA", "BARLEY", "BASIL", "BAYLEAF", "GREENBEAN", "LIMABEAN", "WHITEBEAN", "BEEFIGE", "REDBEET", "BLUEBERRY", "BROCCOLI", "CABBAGE", "MELON", "CARROT", "CASEIN", "CASHEWNUT", "CAULIFLOWER", "CELERY"     Note: Lab results reviewed.  [place]  Drug: Sue Cook  reports no history of drug use. Alcohol:  reports current alcohol use. Tobacco:  reports that she has never smoked. She has never been exposed to tobacco smoke. She has never used smokeless tobacco. Medical:  has a past medical history of Ankle pain (49/1791), Complication of anesthesia, GERD (gastroesophageal reflux disease), Headache, and PONV (postoperative nausea and vomiting). Family: family history includes Breast cancer in her maternal aunt and maternal grandmother; Cancer in her mother; Diabetes in her sister; Heart attack in her father, maternal grandfather, maternal grandmother, and paternal grandfather.  Past Surgical History:  Procedure Laterality Date   ABDOMINAL HYSTERECTOMY     CARPAL TUNNEL RELEASE Bilateral    CHOLECYSTECTOMY N/A 06/11/2016   Procedure: LAPAROSCOPIC CHOLECYSTECTOMY WITH INTRAOPERATIVE CHOLANGIOGRAM;  Surgeon: Leonie Green, MD;  Location: [facility] ORS;  Service: General;  Laterality: N/A;   ENDOMETRIAL BIOPSY     FOOT SURGERY Right    WISDOM TOOTH EXTRACTION     Active Ambulatory Problems    Diagnosis Date Noted   Carpal tunnel syndrome 05/05/2013   Migraine without aura 01/17/2014   BMI 45.0-49.9, adult ([place]) 01/26/2018   Family history of heart attack 10/03/2019   Family history of breast cancer 10/03/2019   Family history of cancer 10/03/2019   Elevated LDL cholesterol level 01/17/2020   Cervical prolapse 01/17/2020   Complex regional pain syndrome type 1 of right lower extremity 10/11/2021   Chronic  pain syndrome 10/11/2021   Resolved Ambulatory Problems    Diagnosis Date Noted   No Resolved Ambulatory Problems   Past Medical History:  Diagnosis Date   Ankle pain 50/5697   Complication of anesthesia    GERD (gastroesophageal reflux disease)    Headache    PONV (postoperative nausea and vomiting)    Constitutional Exam  General appearance: Well nourished, well developed, and well hydrated. In no apparent acute distress Vitals:   10/11/21 1002  BP: (!) 153/84  Pulse: 71  Resp: 16  Temp: (!) 97.4 F (36.3 C)  SpO2: 100%  Weight: 219 lb (99.3 kg)  Height: _0  (1.651 m)   BMI Assessment: Estimated body mass index is 36.44 kg/m as calculated from the following:   Height as of this encounter: _1  (1.651 m).   Weight as of this encounter: 219 lb (99.3 kg).  BMI interpretation table: BMI level Category Range association with higher incidence of chronic pain  <18 kg/m2 Underweight   18.5-24.9 kg/m2 Ideal body weight   25-29.9 kg/m2 Overweight Increased incidence by 20%  30-34.9 kg/m2 Obese (Class I) Increased incidence by 68%  35-39.9 kg/m2 Severe obesity (Class II) Increased incidence by 136%  >40 kg/m2 Extreme obesity (Class III) Increased incidence by 254%   Patient's current  BMI Ideal Body weight  Body mass index is 36.44 kg/m. Ideal body weight: 57 kg (125 lb 10.6 oz) Adjusted ideal body weight: 73.9 kg (163 lb)   BMI Readings from Last 4 Encounters:  10/11/21 36.44 kg/m  01/17/20 36.54 kg/m  09/30/19 38.26 kg/m  02/19/18 46.71 kg/m   Wt Readings from Last 4 Encounters:  10/11/21 219 lb (99.3 kg)  01/17/20 219 lb 9.6 oz (99.6 kg)  09/30/19 229 lb 14.4 oz (104.3 kg)  02/19/18 280 lb 11.2 oz (127.3 kg)    Psych/Mental status: Alert, oriented x 3 (person, place, & time)       Eyes: PERLA Respiratory: No evidence of acute respiratory distress  Lumbar Spine Area Exam  Skin & Axial Inspection: No masses, redness, or swelling Alignment:  Symmetrical Functional ROM: Unrestricted ROM       Stability: No instability detected Muscle Tone/Strength: Functionally intact. No obvious neuro-muscular anomalies detected. Sensory (Neurological): Unimpaired  Gait & Posture Assessment  Ambulation: Unassisted Gait: Relatively normal for age and body habitus Posture: WNL   Lower Extremity Exam    Side: Right lower extremity  Side: Left lower extremity  Stability: No instability observed          Stability: No instability observed          Skin & Extremity Inspection: Evidence of prior arthroplastic surgery mild allodynia, pseudomotor changes, mild erythema consistent with CRPS  Skin & Extremity Inspection: Skin color, temperature, and hair growth are WNL. No peripheral edema or cyanosis. No masses, redness, swelling, asymmetry, or associated skin lesions. No contractures.  Functional ROM: Pain restricted ROM        foot and ankle          Functional ROM: Unrestricted ROM                  Muscle Tone/Strength: Functionally intact. No obvious neuro-muscular anomalies detected.  Muscle Tone/Strength: Functionally intact. No obvious neuro-muscular anomalies detected.  Sensory (Neurological): Neurogenic pain pattern        Sensory (Neurological): Unimpaired        DTR: Patellar: deferred today Achilles: deferred today Plantar: deferred today  DTR: Patellar: deferred today Achilles: deferred today Plantar: deferred today  Palpation: No palpable anomalies  Palpation: No palpable anomalies  Dorsalis pedis and posterior tibial pulse intact  Assessment  Primary Diagnosis & Pertinent Problem List: The primary encounter diagnosis was Complex regional pain syndrome type 1 of right lower extremity. A diagnosis of Chronic pain syndrome was also pertinent to this visit.  Visit Diagnosis (New problems to examiner): 1. Complex regional pain syndrome type 1 of right lower extremity   2. Chronic pain syndrome    Plan of Care (Initial workup plan)    I discussed interventional options with her for CRPS which include physical therapy, cognitive behavioral therapy, tricyclic antidepressants, anticonvulsants including gabapentin Lyrica, lumbar sympathetic nerve block, spinal cord stimulation.  This was discussed with the patient in great detail.  Risks of each therapeutic option was reviewed with patient and her husband.  They would like to proceed with lumbar sympathetic nerve block.  We will plan on doing this with sedation under fluoroscopy.  If this is not effective, we can consider spinal cord stimulation for CRPS.   Procedure Orders         LUMBAR SYMPATHETIC BLOCK      Future considerations include spinal cord stimulation.  Provider-requested follow-up: Return in about 2 weeks (around 10/25/2021) for RIGHT L3 LSB , in clinic  NS.  Future Appointments  Date Time Provider [place]  11/13/2021 10:45 AM Felipa Furnace, DPM [id number]    Note by: Gillis Santa, MD Date: 10/11/2021; Time: 11:11 AM

## 2021-10-11 NOTE — Progress Notes (Signed)
Safety precautions to be maintained throughout the outpatient stay will include: orient to surroundings, keep bed in low position, maintain call bell within reach at all times, provide assistance with transfer out of bed and ambulation.  

## 2021-10-11 NOTE — Patient Instructions (Signed)
Sedation with IV versed.  Instructed to bring a driver.

## 2021-10-24 ENCOUNTER — Ambulatory Visit
Payer: Managed Care, Other (non HMO) | Attending: Student in an Organized Health Care Education/Training Program | Admitting: Student in an Organized Health Care Education/Training Program

## 2021-10-24 ENCOUNTER — Encounter: Payer: Self-pay | Admitting: Student in an Organized Health Care Education/Training Program

## 2021-10-24 ENCOUNTER — Ambulatory Visit
Admission: RE | Admit: 2021-10-24 | Discharge: 2021-10-24 | Disposition: A | Discharge status: Home / Self Care | Payer: Managed Care, Other (non HMO) | Source: Ambulatory Visit | Attending: Student in an Organized Health Care Education/Training Program | Admitting: Student in an Organized Health Care Education/Training Program

## 2021-10-24 DIAGNOSIS — G894 Chronic pain syndrome: Secondary | ICD-10-CM | POA: Diagnosis present

## 2021-10-24 DIAGNOSIS — G90521 Complex regional pain syndrome I of right lower limb: Secondary | ICD-10-CM | POA: Insufficient documentation

## 2021-10-24 MED ORDER — MIDAZOLAM HCL 5 MG/5ML IJ SOLN
0.5000 mg | Freq: Once | INTRAMUSCULAR | Status: AC
  Administered 2021-10-24: 2 mg via INTRAVENOUS
  Filled 2021-10-24: qty 5

## 2021-10-24 MED ORDER — LACTATED RINGERS IV SOLN: Freq: Once | INTRAVENOUS | Status: AC

## 2021-10-24 MED ORDER — LIDOCAINE HCL 2 % IJ SOLN
20.0000 mL | Freq: Once | INTRAMUSCULAR | Status: AC
  Administered 2021-10-24: 400 mg
  Filled 2021-10-24: qty 20

## 2021-10-24 MED ORDER — BUPIVACAINE-EPINEPHRINE (PF) 0.25% -1:200000 IJ SOLN
10.0000 mL | Freq: Once | INTRAMUSCULAR | Status: AC
  Administered 2021-10-24: 10 mL
  Filled 2021-10-24: qty 30

## 2021-10-24 MED ORDER — IOHEXOL 180 MG/ML  SOLN
INTRAMUSCULAR | Status: AC
  Filled 2021-10-24: qty 20

## 2021-10-24 MED ORDER — IOHEXOL 180 MG/ML  SOLN: 10.0000 mL | Freq: Once | INTRAMUSCULAR | Status: DC

## 2021-10-24 MED ORDER — DEXAMETHASONE SODIUM PHOSPHATE 10 MG/ML IJ SOLN
10.0000 mg | Freq: Once | INTRAMUSCULAR | Status: AC
  Administered 2021-10-24: 10 mg
  Filled 2021-10-24: qty 1

## 2021-10-24 NOTE — Progress Notes (Signed)
PROVIDER NOTE: Interpretation of information contained herein should be left to medically-trained personnel. Specific patient instructions are provided elsewhere under "Patient Instructions" section of medical record. This document was created in part using STT-dictation technology, any transcriptional errors that may result from this process are unintentional.  Patient: Sue Cook Type: Established DOB: Jun 23, 1980 MRN: [id number] PCP: Derinda Late, MD  Service: Procedure DOS: 10/24/2021 Setting: Ambulatory Location: Ambulatory outpatient facility Delivery: Face-to-face Provider: Gillis Santa, MD Specialty: Interventional Pain Management Specialty designation: 09 Location: Outpatient facility Ref. Prov.: Gillis Santa, MD    Primary Reason for Visit: Interventional Pain Management Treatment. CC: Foot Pain (Right)    Procedure:          Anesthesia, Analgesia, Anxiolysis:  Type: Diagnostic Lumbar Sympathetic Block #1  Region:Lumbosacral Level: L3 Laterality: Right-Sided Paravertebral  Anesthesia: Local (1-2% Lidocaine)  Anxiolysis: IV Versed x 2 mg (minimal)  Guidance: Fluoroscopy           Position: Prone with head of the table was raised to facilitate breathing.   1. Complex regional pain syndrome type 1 of right lower extremity   2. Chronic pain syndrome    NAS-11 Pain score:   Pre-procedure: 3 /10   Post-procedure: 0-No pain/10     Pre-op H&P Assessment:  Sue Cook is a 41 y.o. (year old), female patient, seen today for interventional treatment. She  has a past surgical history that includes Abdominal hysterectomy; Wisdom tooth extraction; Endometrial biopsy; Cholecystectomy (N/A, 06/11/2016); Foot surgery (Right); and Carpal tunnel release (Bilateral). Sue Cook has a current medication list which includes the following prescription(s): cholecalciferol, cyanocobalamin, multivitamin with minerals, and sumatriptan. Her primarily concern today is the Foot Pain  (Right)  Initial Vital Signs:  Pulse/HCG Rate: 73  Temp: (!) 97.5 F (36.4 C) Resp: 17 BP: (!) 141/86 SpO2: 100 %  BMI: Estimated body mass index is 39.94 kg/m as calculated from the following:   Height as of this encounter: 5\' 5"  (1.651 m).   Weight as of this encounter: 240 lb (108.9 kg).  Risk Assessment: Allergies: Reviewed. She has No Known Allergies.  Allergy Precautions: None required Coagulopathies: Reviewed. None identified.  Blood-thinner therapy: None at this time Active Infection(s): Reviewed. None identified. Sue Cook is afebrile  Site Confirmation: Sue Cook was asked to confirm the procedure and laterality before marking the site Procedure checklist: Completed Consent: Before the procedure and under the influence of no sedative(s), amnesic(s), or anxiolytics, the patient was informed of the treatment options, risks and possible complications. To fulfill our ethical and legal obligations, as recommended by the American Medical Association's Code of Ethics, I have informed the patient of my clinical impression; the nature and purpose of the treatment or procedure; the risks, benefits, and possible complications of the intervention; the alternatives, including doing nothing; the risk(s) and benefit(s) of the alternative treatment(s) or procedure(s); and the risk(s) and benefit(s) of doing nothing. The patient was provided information about the general risks and possible complications associated with the procedure. These may include, but are not limited to: failure to achieve desired goals, infection, bleeding, organ or nerve damage, allergic reactions, paralysis, and death. In addition, the patient was informed of those risks and complications associated to Spine-related procedures, such as failure to decrease pain; infection (i.e.: Meningitis, epidural or intraspinal abscess); bleeding (i.e.: epidural hematoma, subarachnoid hemorrhage, or any other type of intraspinal or  peri-dural bleeding); organ or nerve damage (i.e.: Any type of peripheral nerve, nerve root, or spinal cord injury) with subsequent damage  to sensory, motor, and/or autonomic systems, resulting in permanent pain, numbness, and/or weakness of one or several areas of the body; allergic reactions; (i.e.: anaphylactic reaction); and/or death. Furthermore, the patient was informed of those risks and complications associated with the medications. These include, but are not limited to: allergic reactions (i.e.: anaphylactic or anaphylactoid reaction(s)); adrenal axis suppression; blood sugar elevation that in diabetics may result in ketoacidosis or comma; water retention that in patients with history of congestive heart failure may result in shortness of breath, pulmonary edema, and decompensation with resultant heart failure; weight gain; swelling or edema; medication-induced neural toxicity; particulate matter embolism and blood vessel occlusion with resultant organ, and/or nervous system infarction; and/or aseptic necrosis of one or more joints. Finally, the patient was informed that Medicine is not an exact science; therefore, there is also the possibility of unforeseen or unpredictable risks and/or possible complications that may result in a catastrophic outcome. The patient indicated having understood very clearly. We have given the patient no guarantees and we have made no promises. Enough time was given to the patient to ask questions, all of which were answered to the patient's satisfaction. Sue Cook has indicated that she wanted to continue with the procedure. Attestation: I, the ordering provider, attest that I have discussed with the patient the benefits, risks, side-effects, alternatives, likelihood of achieving goals, and potential problems during recovery for the procedure that I have provided informed consent. Date  Time: 10/24/2021  8:13 AM  Pre-Procedure Preparation:  Monitoring: As per clinic  protocol. Respiration, ETCO2, SpO2, BP, heart rate and rhythm monitor placed and checked for adequate function Safety Precautions: Patient was assessed for positional comfort and pressure points before starting the procedure. Time-out: I initiated and conducted the "Time-out" before starting the procedure, as per protocol. The patient was asked to participate by confirming the accuracy of the "Time Out" information. Verification of the correct person, site, and procedure were performed and confirmed by me, the nursing staff, and the patient. "Time-out" conducted as per Joint Commission's Universal Protocol (UP.01.01.01). Time: 0905  Description of Procedure:          Target Area: For Lumbar Sympathetic Block(s), the target is the anterolateral aspect of the L3 vertebral bodies, where the lumbar sympathetic chain resides. Approach: Paravertebral, ipsilateral approach. Area Prepped: Entire Posterior Thoracolumbar Region DuraPrep (Iodine Povacrylex [0.7% available iodine] and Isopropyl Alcohol, 74% w/w) Safety Precautions: Aspiration looking for blood return was conducted prior to all injections. At no point did we inject any substances, as a needle was being advanced. No attempts were made at seeking any paresthesias. Safe injection practices and needle disposal techniques used. Medications properly checked for expiration dates. SDV (single dose vial) medications used. Description of the Procedure: Protocol guidelines were followed. The patient was placed in position over the procedure table. The target area was identified and the area prepped in the usual manner. Skin & deeper tissues infiltrated with local anesthetic. Appropriate amount of time allowed to pass for local anesthetics to take effect. The procedure needles were then advanced to the target area, the superior anterolateral border of the L3 vertebral body, under pulsed fluoroscopic guidance. Care was taken not to advance the tip of the needle  past the anterior border of the vertebral body, on the lateral fluoroscopic view. Proper needle placement secured. Negative aspiration confirmed. Solution injected in intermittent fashion, asking for systemic symptoms every 0.5cc of injectate. The needles were then removed and the area cleansed, making sure to leave some of  the prepping solution back to take advantage of its long term bactericidal properties. Vitals:   10/24/21 0912 10/24/21 0917 10/24/21 0922 10/24/21 0930  BP: 134/88 131/89  111/76  Pulse: 72 73 74 80  Resp: 18 19 20 18   Temp:      TempSrc:      SpO2: 100% 99% 99% 100%  Weight:      Height:        Start Time: 0905 hrs. End Time: 0921 hrs. Materials:  Needle(s) Type: Spinal Needle Gauge: 22G Length: 7-in Medication(s): Please see orders for medications and dosing details. 12 cc solution made of 11 cc of 0.25% bupivacaine with epinephrine, 1 cc of Decadron 10 mg/cc.  Injected in 4 cc aliquots while patient was monitored to ensure no adverse side effects or complications.  Patient tolerated lumbar sympathetic block well.  Her right lower extremity temperature from her foot increased from 90.4--->95 degrees F after her block. Imaging Guidance (Spinal):          Type of Imaging Technique: Fluoroscopy Guidance (Spinal) Indication(s): Assistance in needle guidance and placement for procedures requiring needle placement in or near specific anatomical locations not easily accessible without such assistance. Exposure Time: Please see nurses notes. Contrast: Before injecting any contrast, we confirmed that the patient did not have an allergy to iodine, shellfish, or radiological contrast. Once satisfactory needle placement was completed at the desired level, radiological contrast was injected. Contrast injected under live fluoroscopy. No contrast complications. See chart for type and volume of contrast used. Fluoroscopic Guidance: I was personally present during the use of  fluoroscopy. "Tunnel Vision Technique" used to obtain the best possible view of the target area. Parallax error corrected before commencing the procedure. "Direction-depth-direction" technique used to introduce the needle under continuous pulsed fluoroscopy. Once target was reached, antero-posterior, oblique, and lateral fluoroscopic projection used confirm needle placement in all planes. Images permanently stored in EMR. Interpretation: I personally interpreted the imaging intraoperatively. Adequate needle placement confirmed in multiple planes. Appropriate spread of contrast into desired area was observed. No evidence of afferent or efferent intravascular uptake. No intrathecal or subarachnoid spread observed. Permanent images saved into the patient's record.  Antibiotic Prophylaxis:   Anti-infectives (From admission, onward)    None      Indication(s): None identified  Post-operative Assessment:  Post-procedure Vital Signs:  Pulse/HCG Rate: 80  Temp:  (right foot 94.1  left foot 85.8) Resp: 18 BP: 111/76 SpO2: 100 %  EBL: None  Complications: No immediate post-treatment complications observed by team, or reported by patient.  Note: The patient tolerated the entire procedure well. A repeat set of vitals were taken after the procedure and the patient was kept under observation following institutional policy, for this type of procedure. Post-procedural neurological assessment was performed, showing return to baseline, prior to discharge. The patient was provided with post-procedure discharge instructions, including a section on how to identify potential problems. Should any problems arise concerning this procedure, the patient was given instructions to immediately contact us, at any time, without hesitation. In any case, we plan to contact the patient by telephone for a follow-up status report regarding this interventional procedure.  Comments:  No additional relevant information.  Plan  of Care  Orders:  Orders Placed This Encounter  Procedures   DG PAIN CLIN[facility] C-ARM 1-60 MIN NO REPORT    Intraoperative interpretation by procedural physician at [place].    Standing Status:   Standing    Number of Occurrences:  1    Order Specific Question:   Reason for exam:    Answer:   Assistance in needle guidance and placement for procedures requiring needle placement in or near specific anatomical locations not easily accessible without such assistance.     Medications ordered for procedure: Meds ordered this encounter  Medications   lidocaine (XYLOCAINE) 2 % (with pres) injection 400 mg   lactated ringers infusion   midazolam (VERSED) 5 MG/5ML injection 0.5-2 mg    Make sure Flumazenil is available in the pyxis when using this medication. If oversedation occurs, administer 0.2 mg IV over 15 sec. If after 45 sec no response, administer 0.2 mg again over 1 min; may repeat at 1 min intervals; not to exceed 4 doses (1 mg)   dexamethasone (DECADRON) injection 10 mg   bupivacaine-epinephrine (PF) (MARCAINE W/ EPI) 0.25% -1:200000 injection 10 mL   Medications administered: We administered lidocaine, lactated ringers, midazolam, dexamethasone, and bupivacaine-epinephrine (PF).  See the medical record for exact dosing, route, and time of administration.  Follow-up plan:   Return in about 4 weeks (around 11/21/2021) for Post Procedure Evaluation, in person.       Right L3 LSB 10/24/21   Recent Visits Date Type Provider Dept  10/11/21 Office Visit Gillis Santa, MD [facility]  Showing recent visits within past 90 days and meeting all other requirements Today's Visits Date Type Provider Dept  10/24/21 Procedure visit Gillis Santa, MD [facility]  Showing today's visits and meeting all other requirements Future Appointments Date Type Provider Dept  11/12/21 Appointment Gillis Santa, MD [facility]  Showing future appointments within  next 90 days and meeting all other requirements  Disposition: Discharge home  Discharge (Date  Time): 10/24/2021; 0936 hrs.   Primary Care Physician: Derinda Late, MD Location: [facility] Note by: Gillis Santa, MD Date: 10/24/2021; Time: 10:13 AM  Disclaimer:  Medicine is not an exact science. The only guarantee in medicine is that nothing is guaranteed. It is important to note that the decision to proceed with this intervention was based on the information collected from the patient. The Data and conclusions were drawn from the patient's questionnaire, the interview, and the physical examination. Because the information was provided in large part by the patient, it cannot be guaranteed that it has not been purposely or unconsciously manipulated. Every effort has been made to obtain as much relevant data as possible for this evaluation. It is important to note that the conclusions that lead to this procedure are derived in large part from the available data. Always take into account that the treatment will also be dependent on availability of resources and existing treatment guidelines, considered by other Pain Management Practitioners as being common knowledge and practice, at the time of the intervention. For Medico-Legal purposes, it is also important to point out that variation in procedural techniques and pharmacological choices are the acceptable norm. The indications, contraindications, technique, and results of the above procedure should only be interpreted and judged by a Board-Certified Interventional Pain Specialist with extensive familiarity and expertise in the same exact procedure and technique.

## 2021-10-24 NOTE — Patient Instructions (Signed)

## 2021-10-24 NOTE — Progress Notes (Signed)
Safety precautions to be maintained throughout the outpatient stay will include: orient to surroundings, keep bed in low position, maintain call bell within reach at all times, provide assistance with transfer out of bed and ambulation.  

## 2021-11-12 ENCOUNTER — Ambulatory Visit
Admission: RE | Admit: 2021-11-12 | Discharge: 2021-11-12 | Disposition: A | Discharge status: Home / Self Care | Payer: Managed Care, Other (non HMO) | Source: Ambulatory Visit | Attending: Student in an Organized Health Care Education/Training Program | Admitting: Student in an Organized Health Care Education/Training Program

## 2021-11-12 ENCOUNTER — Ambulatory Visit: Payer: Managed Care, Other (non HMO) | Admitting: Student in an Organized Health Care Education/Training Program

## 2021-11-12 ENCOUNTER — Encounter: Payer: Self-pay | Admitting: Student in an Organized Health Care Education/Training Program

## 2021-11-12 VITALS — BP 140/89 | HR 80 | Temp 97.9°F | Ht 65.0 in | Wt 240.0 lb

## 2021-11-12 DIAGNOSIS — G8929 Other chronic pain: Secondary | ICD-10-CM | POA: Insufficient documentation

## 2021-11-12 DIAGNOSIS — G894 Chronic pain syndrome: Secondary | ICD-10-CM | POA: Insufficient documentation

## 2021-11-12 DIAGNOSIS — M25561 Pain in right knee: Secondary | ICD-10-CM | POA: Insufficient documentation

## 2021-11-12 DIAGNOSIS — M1711 Unilateral primary osteoarthritis, right knee: Secondary | ICD-10-CM

## 2021-11-12 DIAGNOSIS — G90521 Complex regional pain syndrome I of right lower limb: Secondary | ICD-10-CM | POA: Diagnosis not present

## 2021-11-12 NOTE — Progress Notes (Signed)
PROVIDER NOTE: Information contained herein reflects review and annotations entered in association with encounter. Interpretation of such information and data should be left to medically-trained personnel. Information provided to patient can be located elsewhere in the medical record under "Patient Instructions". Document created using STT-dictation technology, any transcriptional errors that may result from process are unintentional.    Patient: Sue Cook  Service Category: E/M  Provider: Gillis Santa, MD  DOB: Jun 16, 1980  DOS: 11/12/2021  Referring Provider: Derinda Late, MD  MRN: [id number]  Specialty: Interventional Pain Management  PCP: Derinda Late, MD  Type: Established Patient  Setting: Ambulatory outpatient    Location: Office  Delivery: Face-to-face     HPI  Sue Cook, a 41 y.o. year old female, is here today because of her Complex regional pain syndrome type 1 of right lower extremity [G90.521]. [name]'s primary complain today is Ankle Pain (Right ankle) and Knee Pain (right) Last encounter: My last encounter with her was on 10/24/2021. Pertinent problems: Sue Cook has Complex regional pain syndrome type 1 of right lower extremity; Chronic pain syndrome; Chronic pain of right knee; and Primary osteoarthritis of right knee on their pertinent problem list. Pain Assessment: Severity of Chronic pain is reported as a 2 /10. Location: Ankle Right/no. Onset: More than a month ago. Quality: Constant, Discomfort. Timing: Constant. Modifying factor(s): rest, meds sometimes, procedure some. Vitals:  height is 5' 5"  (1.651 m) and weight is 240 lb (108.9 kg). Her temporal temperature is 97.9 F (36.6 C). Her blood pressure is 140/89 (abnormal) and her pulse is 80. Her oxygen saturation is 100%.   Reason for encounter: post-procedure evaluation and assessment.  She is also complaining of right knee pain.   HPI from initial clinic visit: [name] is a pleasant 41 year old  female who presents with a chief complaint of right foot pain believed to be related to complex regional pain syndrome.  She is being referred by Dr. Posey Pronto.  Of note she has a history of right foot surgery.  MRI of her right ankle showed mild osteoarthritis.  She states that her foot surgery was not helpful.  She has been having difficulty managing her pain.  She does endorse numbness and tingling, redness, difficulty with movement and excruciating pain at times of her right foot.  She has had cortisone injections in her right foot with limited response.  She is currently on short-term disability.  Of note her foot surgery was November 2022 in which Dr. Posey Pronto reportedly repaired a tendon, placed a graft and fixed the ligament.  She had recurrence of her symptoms thereafter.  She lost her job given inability to participate in work activities.  She has taken gabapentin in the past.  She has tried Effexor as well but had sedation and mood changes.  She has not tried Lyrica.  She is not interested in medication management per se.  I discussed interventional options with her for CRPS which include physical therapy, cognitive behavioral therapy, tricyclic antidepressants, anticonvulsants including gabapentin Lyrica, lumbar sympathetic nerve block, spinal cord stimulation.  This was discussed with the patient in great detail.  Risks of each therapeutic option was reviewed with patient and her husband.  They would like to proceed with lumbar sympathetic nerve block.  We will plan on doing this with sedation under fluoroscopy.  If this is not effective, we can consider spinal cord stimulation for CRPS.  Post-procedure evaluation    Procedure:          Anesthesia,  Analgesia, Anxiolysis:  Type: Diagnostic Lumbar Sympathetic Block #1  Region:Lumbosacral Level: L3 Laterality: Right-Sided Paravertebral  Anesthesia: Local (1-2% Lidocaine)  Anxiolysis: IV Versed x 2 mg (minimal)  Guidance: Fluoroscopy            Position: Prone with head of the table was raised to facilitate breathing.   1. Complex regional pain syndrome type 1 of right lower extremity   2. Chronic pain syndrome    NAS-11 Pain score:   Pre-procedure: 3 /10   Post-procedure: 0-No pain/10      Effectiveness:  Initial hour after procedure: 100 %  Subsequent 4-6 hours post-procedure: 100 %  Analgesia past initial 6 hours: 30 % (ongoing)  Ongoing improvement:  Analgesic:  30-40% Function: Somewhat improved ROM: Somewhat improved    ROS  Constitutional: Denies any fever or chills Gastrointestinal: No reported hemesis, hematochezia, vomiting, or acute GI distress Musculoskeletal: Right knee pain Neurological:  Right foot and ankle pain  Medication Review  SUMAtriptan, cholecalciferol, cyanocobalamin, and multivitamin with minerals  History Review  Allergy: Sue Cook has No Known Allergies. Drug: Sue Cook  reports no history of drug use. Alcohol:  reports current alcohol use. Tobacco:  reports that she has never smoked. She has never been exposed to tobacco smoke. She has never used smokeless tobacco. Social: Sue Cook  reports that she has never smoked. She has never been exposed to tobacco smoke. She has never used smokeless tobacco. She reports current alcohol use. She reports that she does not use drugs. Medical:  has a past medical history of Ankle pain (11/5724), Complication of anesthesia, GERD (gastroesophageal reflux disease), Headache, and PONV (postoperative nausea and vomiting). Surgical: Sue Cook  has a past surgical history that includes Abdominal hysterectomy; Wisdom tooth extraction; Endometrial biopsy; Cholecystectomy (N/A, 06/11/2016); Foot surgery (Right); and Carpal tunnel release (Bilateral). Family: family history includes Breast cancer in her maternal aunt and maternal grandmother; Cancer in her mother; Diabetes in her sister; Heart attack in her father, maternal grandfather, maternal  grandmother, and paternal grandfather.  Laboratory Chemistry Profile   Renal Lab Results  Component Value Date   BUN 18 03/27/2021   CREATININE 0.82 03/27/2021   BCR 22 03/27/2021   GFRAA 113 02/11/2018   GFRNONAA 98 02/11/2018    Hepatic Lab Results  Component Value Date   AST 12 02/11/2018   ALT 20 02/11/2018   ALBUMIN 4.7 02/11/2018   ALKPHOS 66 02/11/2018    Electrolytes Lab Results  Component Value Date   NA 141 03/27/2021   K 4.4 03/27/2021   CL 101 03/27/2021   CALCIUM 9.4 03/27/2021   MG 2.1 03/27/2021    Bone Lab Results  Component Value Date   VD25OH 34.5 03/27/2021    Inflammation (CRP: Acute Phase) (ESR: Chronic Phase) No results found for: "CRP", "ESRSEDRATE", "LATICACIDVEN"       Note: Above Lab results reviewed.   Physical Exam  General appearance: Well nourished, well developed, and well hydrated. In no apparent acute distress Mental status: Alert, oriented x 3 (person, place, & time)       Respiratory: No evidence of acute respiratory distress Eyes: PERLA Vitals: BP (!) 140/89 (Patient Position: Sitting)   Pulse 80   Temp 97.9 F (36.6 C) (Temporal)   Ht 5' 5"  (1.651 m)   Wt 240 lb (108.9 kg)   SpO2 100%   BMI 39.94 kg/m  BMI: Estimated body mass index is 39.94 kg/m as calculated from the following:   Height as of  this encounter: 5' 5"  (1.651 m).   Weight as of this encounter: 240 lb (108.9 kg). Ideal: Ideal body weight: 57 kg (125 lb 10.6 oz) Adjusted ideal body weight: 77.7 kg (171 lb 6.4 oz)  Lumbar Spine Area Exam  Skin & Axial Inspection: No masses, redness, or swelling Alignment: Symmetrical Functional ROM: Unrestricted ROM       Stability: No instability detected Muscle Tone/Strength: Functionally intact. No obvious neuro-muscular anomalies detected. Sensory (Neurological): Unimpaired   Gait & Posture Assessment  Ambulation: Unassisted Gait: Relatively normal for age and body habitus Posture: WNL    Lower Extremity Exam       Side: Right lower extremity   Side: Left lower extremity  Stability: No instability observed           Stability: No instability observed          Skin & Extremity Inspection: Evidence of prior arthroplastic surgery mild allodynia, pseudomotor changes, mild erythema consistent with CRPS   Skin & Extremity Inspection: Skin color, temperature, and hair growth are WNL. No peripheral edema or cyanosis. No masses, redness, swelling, asymmetry, or associated skin lesions. No contractures.  Functional ROM: Pain restricted ROM        foot and ankle           Functional ROM: Unrestricted ROM                  Muscle Tone/Strength: Functionally intact. No obvious neuro-muscular anomalies detected.   Muscle Tone/Strength: Functionally intact. No obvious neuro-muscular anomalies detected.  Sensory (Neurological): Neurogenic pain pattern      also musculoskeletal for right knee   Sensory (Neurological): Unimpaired        DTR: Patellar: deferred today Achilles: deferred today Plantar: deferred today   DTR: Patellar: deferred today Achilles: deferred today Plantar: deferred today  Palpation: No palpable anomalies   Palpation: No palpable anomalies  Dorsalis pedis and posterior tibial pulse intact  Assessment   Diagnosis Status  1. Complex regional pain syndrome type 1 of right lower extremity   2. Chronic pain of right knee   3. Primary osteoarthritis of right knee   4. Chronic pain syndrome    Responding Persistent Persistent   Updated Problems: Problem  Chronic Pain of Right Knee  Primary Osteoarthritis of Right Knee  Complex Regional Pain Syndrome Type 1 of Right Lower Extremity  Chronic Pain Syndrome     Plan of Care  Problem-specific:  Complex regional pain syndrome type 1 of right lower extremity Status post right L3 lumbar sympathetic nerve block that provided some pain relief.  Discussed repeating as usually a series of lumbar sympathetic nerve blocks are recommended.  I also  discussed spinal cord stimulation with her however the patient prefers to repeat right L3 LSB.  Risk and benefits reviewed and patient would like to proceed.  Primary osteoarthritis of right knee Patient complaining of right knee pain that is worse with weightbearing.  Recommend knee x-ray.  If overweight, will recommend right knee injection, steroid  Chronic pain syndrome Patient has significant disability from her right lower extremity CRPS.  She is currently on short-term disability.  She is on her feet for 10 hours of the day.  I do not think that she will be able to return to work in that capacity.  I have recommended that she look for a desk job that does not require her to be on her feet for such a long period of time.  I think this  will be better in the overall management of her CRPS.   Orders:  Orders Placed This Encounter  Procedures   LUMBAR SYMPATHETIC BLOCK    For sympathetically-mediated lower extremity pain.    Standing Status:   Future    Standing Expiration Date:   02/12/2022    Scheduling Instructions:     Purpose: Diagnostic     Laterality:RIGHT     Level(s): Lumbar sympathetic chain (L3)     Sedation: Sedation recommended.     Scheduling Timeframe: As permitted by the schedule    Order Specific Question:   Where will this procedure be performed?    Answer:   [facility]   DG Knee Complete 4 Views Right    Standing Status:   Future    Number of Occurrences:   1    Standing Expiration Date:   12/13/2021    Scheduling Instructions:     Imaging must be done as soon as possible. Inform patient that order will expire within 30 days and I will not renew it.    Order Specific Question:   Reason for Exam (SYMPTOM  OR DIAGNOSIS REQUIRED)    Answer:   Right knee pain/arthralgia    Order Specific Question:   Is patient pregnant?    Answer:   No    Order Specific Question:   Preferred imaging location?    Answer:   [facility]    Order Specific Question:    Release to patient    Answer:   Immediate    Order Specific Question:   Call Results- Best Contact Number?    Answer:   [phone] [facility])   Follow-up plan:   Return in about 16 days (around 11/28/2021) for R L3 LSB , in clinic IV Versed.     Right L3 LSB 10/24/21    Recent Visits Date Type Provider Dept  10/24/21 Procedure visit Gillis Santa, MD [facility]  10/11/21 Office Visit Gillis Santa, MD [facility]  Showing recent visits within past 90 days and meeting all other requirements Today's Visits Date Type Provider Dept  11/12/21 Office Visit Gillis Santa, MD [facility]  Showing today's visits and meeting all other requirements Future Appointments No visits were found meeting these conditions. Showing future appointments within next 90 days and meeting all other requirements  I discussed the assessment and treatment plan with the patient. The patient was provided an opportunity to ask questions and all were answered. The patient agreed with the plan and demonstrated an understanding of the instructions.  Patient advised to call back or seek an in-person evaluation if the symptoms or condition worsens.  Duration of encounter: 77mnutes.  Total time on encounter, as per AMA guidelines included both the face-to-face and non-face-to-face time personally spent by the physician and/or other qualified health care professional(s) on the day of the encounter (includes time in activities that require the physician or other qualified health care professional and does not include time in activities normally performed by clinical staff). Physician's time may include the following activities when performed: preparing to see the patient (eg, review of tests, pre-charting review of records) obtaining and/or reviewing separately obtained history performing a medically appropriate examination and/or evaluation counseling and educating the  patient/family/caregiver ordering medications, tests, or procedures referring and communicating with other health care professionals (when not separately reported) documenting clinical information in the electronic or other health record independently interpreting results (not separately reported) and communicating results to  the patient/ family/caregiver care coordination (not separately reported)  Note by: Gillis Santa, MD Date: 11/12/2021; Time: 3:22 PM

## 2021-11-12 NOTE — Assessment & Plan Note (Signed)
Patient complaining of right knee pain that is worse with weightbearing.  Recommend knee x-ray.  If overweight, will recommend right knee injection, steroid

## 2021-11-12 NOTE — Patient Instructions (Addendum)
Sympathetic Nerve Block A sympathetic nerve block is a procedure to help diagnose or relieve pain. The sympathetic nerves run along the spine and control certain processes in the body that happen without thinking about them, such as sweating. A sympathetic nerve block is done to numb these nerves. The procedure may be used to find out if a person's pain is being caused by damaged sympathetic nerves. It can also be used to relieve pain that is caused by nerve damage. It is typically done to treat long-term (chronic) pain. During this procedure, a health care provider will inject a numbing medicine (anesthetic) into a clump of sympathetic nerves. The injection site will depend on where you have pain. If the pain is in your lower body, the medicine may be injected into nerves in your lower back. If the pain is in your upper body, the medicine may be injected into nerves in your neck. If the pain is in your abdomen, the medicine may be injected into nerves in the middle of your back. Pain that is caused by damaged sympathetic nerves will go away for a period of time after the block. The pain relief usually ends when the medicine wears off, but sometimes it continues for longer. Tell a health care provider about: Any allergies you have. All medicines you are taking, including vitamins, herbs, eye drops, creams, and over-the-counter medicines. Any problems you or family members have had with anesthetic medicines. Any bleeding problems you have. Any surgeries you have had. Any medical conditions you have. Whether you are pregnant or may be pregnant. What are the risks? Generally, this is a safe procedure. However, problems may occur, including: Failure of the procedure to relieve your pain. Feeling worse pain than you did before. Bleeding. Allergic reactions to medicines. Infection. Blood vessel damage. Damage to nearby structures or organs, such as temporary or permanent nerve damage. What happens  before the procedure? When to stop eating and drinking Follow instructions from your health care provider about what you may eat and drink before your procedure. These may include: 8 hours before your procedure Stop eating most foods. Do not eat meat, fried foods, or fatty foods. Eat only light foods, such as toast or crackers. All liquids are okay except energy drinks and alcohol. 6 hours before your procedure Stop eating. Drink only clear liquids, such as water, clear fruit juice, black coffee, plain tea, and sports drinks. Do not drink energy drinks or alcohol. 2 hours before your procedure Stop drinking all liquids. You may be allowed to take medicines with small sips of water. If you do not follow your health care provider's instructions, your procedure may be delayed or canceled. Medicines Ask your health care provider about: Changing or stopping your regular medicines. This is especially important if you are taking diabetes medicines or blood thinners. Taking medicines such as aspirin and ibuprofen. These medicines can thin your blood. Do not take these medicines unless your health care provider tells you to take them. Taking over-the-counter medicines, vitamins, herbs, and supplements. General instructions If you will be going home right after the procedure, plan to have a responsible adult: Take you home from the hospital or clinic. You will not be allowed to drive. Care for you for the time you are told. What happens during the procedure?  An IV will be inserted into one of your veins. You will be given one or more of the following: A medicine to help you relax (sedative). A medicine to numb the  area (local anesthetic). A needle will be inserted into the numbed area. Your health care provider will use an imaging tool, such as an X-ray or a CT scan, to help put the needle in the right place. The anesthetic for the block will be injected into the nerves. The needle will be  removed. The IV will be removed. A small bandage (dressing) may be placed over the area where the needle was put in. The procedure may vary among health care providers and hospitals. What can I expect after the procedure? Your blood pressure, heart rate, breathing rate, and blood oxygen level will be monitored until you leave the hospital or clinic. If you were given a sedative during the procedure, it can affect you for several hours. Do not drive or operate machinery until your health care provider says that it is safe. For 24-48 hours after the procedure, it is common for the area where the medicine was injected to be: Red. Sore. Warm. Weak. Numb. If the injection was made in your neck, you may also have: Voice changes. A droopy eyelid. Trouble swallowing. A stuffy nose. Follow these instructions at home: For the first 24 hours after your procedure: Rest. Avoid activities that require a lot of energy. Keep track of the amount of pain relief you feel and how long it lasts. Do not apply heat near or over the injection sites. Do not take a bath or soak in water--such as in a swimming pool, lake, or hot tub--until your health care provider approves. Take over-the-counter and prescription medicines only as told by your health care provider. If you have trouble swallowing, take small bites when eating and small sips of water when drinking until you are able to swallow normally. Keep all follow-up visits. Contact a health care provider if: You have numbness that lasts longer than 8 hours. You continue to have pain for more than 24 hours after your procedure. You have any of these signs of infection: Worsening pain or swelling around the injection site. Red streaks around the injection site. Fluid or blood coming from the injection site. Warmth coming from the injection site 2 days or more after the procedure. Pus or a bad smell coming from the injection site. A fever or chills. Get  help right away if: You cannot swallow. You have chest pain. You have trouble breathing. These symptoms may be an emergency. Get help right away. Call 911. Do not wait to see if the symptoms will go away. Do not drive yourself to the hospital. Summary A sympathetic nerve block is a procedure to numb sympathetic nerves. It may be done to check if you have damage to sympathetic nerves or to help with pain that is caused by damage to these nerves. During this procedure, a health care provider will inject a numbing medicine (anesthetic) into a clump of sympathetic nerves. Plan to have a responsible adult take you home from the hospital or clinic. This information is not intended to replace advice given to you by your health care provider. Make sure you discuss any questions you have with your health care provider. Document Revised: 12/07/2020 Document Reviewed: 07/10/2020 Elsevier Patient Education  2023 [employer] GENERAL RISKS AND COMPLICATIONS  What are the risk, side effects and possible complications? Generally speaking, most procedures are safe.  However, with any procedure there are risks, side effects, and the possibility of complications.  The risks and complications are dependent upon the sites that are lesioned, or the type of  nerve block to be performed.  The closer the procedure is to the spine, the more serious the risks are.  Great care is taken when placing the radio frequency needles, block needles or lesioning probes, but sometimes complications can occur. Infection: Any time there is an injection through the skin, there is a risk of infection.  This is why sterile conditions are used for these blocks.  There are four possible types of infection. Localized skin infection. Central Nervous System Infection-This can be in the form of Meningitis, which can be deadly. Epidural Infections-This can be in the form of an epidural abscess, which can cause pressure inside of the spine,  causing compression of the spinal cord with subsequent paralysis. This would require an emergency surgery to decompress, and there are no guarantees that the patient would recover from the paralysis. Discitis-This is an infection of the intervertebral discs.  It occurs in about 1% of discography procedures.  It is difficult to treat and it may lead to surgery.        2. Pain: the needles have to go through skin and soft tissues, will cause soreness.       3. Damage to internal structures:  The nerves to be lesioned may be near blood vessels or    other nerves which can be potentially damaged.       4. Bleeding: Bleeding is more common if the patient is taking blood thinners such as  aspirin, Coumadin, Ticiid, Plavix, etc., or if he/she have some genetic predisposition  such as hemophilia. Bleeding into the spinal canal can cause compression of the spinal  cord with subsequent paralysis.  This would require an emergency surgery to  decompress and there are no guarantees that the patient would recover from the  paralysis.       5. Pneumothorax:  Puncturing of a lung is a possibility, every time a needle is introduced in  the area of the chest or upper back.  Pneumothorax refers to free air around the  collapsed lung(s), inside of the thoracic cavity (chest cavity).  Another two possible  complications related to a similar event would include: Hemothorax and Chylothorax.   These are variations of the Pneumothorax, where instead of air around the collapsed  lung(s), you may have blood or chyle, respectively.       6. Spinal headaches: They may occur with any procedures in the area of the spine.       7. Persistent CSF (Cerebro-Spinal Fluid) leakage: This is a rare problem, but may occur  with prolonged intrathecal or epidural catheters either due to the formation of a fistulous  track or a dural tear.       8. Nerve damage: By working so close to the spinal cord, there is always a possibility of  nerve damage,  which could be as serious as a permanent spinal cord injury with  paralysis.       9. Death:  Although rare, severe deadly allergic reactions known as "Anaphylactic  reaction" can occur to any of the medications used.      10. Worsening of the symptoms:  We can always make thing worse.  What are the chances of something like this happening? Chances of any of this occuring are extremely low.  By statistics, you have more of a chance of getting killed in a motor vehicle accident: while driving to the hospital than any of the above occurring .  Nevertheless, you should be aware that they  are possibilities.  In general, it is similar to taking a shower.  Everybody knows that you can slip, hit your head and get killed.  Does that mean that you should not shower again?  Nevertheless always keep in mind that statistics do not mean anything if you happen to be on the wrong side of them.  Even if a procedure has a 1 (one) in a 1,000,000 (million) chance of going wrong, it you happen to be that one..Also, keep in mind that by statistics, you have more of a chance of having something go wrong when taking medications.  Who should not have this procedure? If you are on a blood thinning medication (e.g. Coumadin, Plavix, see list of "Blood Thinners"), or if you have an active infection going on, you should not have the procedure.  If you are taking any blood thinners, please inform your physician.  How should I prepare for this procedure? Do not eat or drink anything at least six hours prior to the procedure. Bring a driver with you .  It cannot be a taxi. Come accompanied by an adult that can drive you back, and that is strong enough to help you if your legs get weak or numb from the local anesthetic. Take all of your medicines the morning of the procedure with just enough water to swallow them. If you have diabetes, make sure that you are scheduled to have your procedure done first thing in the morning, whenever  possible. If you have diabetes, take only half of your insulin dose and notify our nurse that you have done so as soon as you arrive at the clinic. If you are diabetic, but only take blood sugar pills (oral hypoglycemic), then do not take them on the morning of your procedure.  You may take them after you have had the procedure. Do not take aspirin or any aspirin-containing medications, at least eleven (11) days prior to the procedure.  They may prolong bleeding. Wear loose fitting clothing that may be easy to take off and that you would not mind if it got stained with Betadine or blood. Do not wear any jewelry or perfume Remove any nail coloring.  It will interfere with some of our monitoring equipment.  NOTE: Remember that this is not meant to be interpreted as a complete list of all possible complications.  Unforeseen problems may occur.  BLOOD THINNERS The following drugs contain aspirin or other products, which can cause increased bleeding during surgery and should not be taken for 2 weeks prior to and 1 week after surgery.  If you should need take something for relief of minor pain, you may take acetaminophen which is found in Tylenol,m Datril, Anacin-3 and Panadol. It is not blood thinner. The products listed below are.  Do not take any of the products listed below in addition to any listed on your instruction sheet.  A.P.C or A.P.C with Codeine Codeine Phosphate Capsules #3 Ibuprofen Ridaura  ABC compound Congesprin Imuran rimadil  Advil Cope Indocin Robaxisal  Alka-Seltzer Effervescent Pain Reliever and Antacid Coricidin or Coricidin-D  Indomethacin Rufen  Alka-Seltzer plus Cold Medicine Cosprin Ketoprofen S-A-C Tablets  Anacin Analgesic Tablets or Capsules Coumadin Korlgesic Salflex  Anacin Extra Strength Analgesic tablets or capsules CP-2 Tablets Lanoril Salicylate  Anaprox Cuprimine Capsules Levenox Salocol  Anexsia-D Dalteparin Magan Salsalate  Anodynos Darvon compound Magnesium  Salicylate Sine-off  Ansaid Dasin Capsules Magsal Sodium Salicylate  Anturane Depen Capsules Marnal Soma  APF Arthritis pain formula Dewitt's  Pills Measurin Stanback  Argesic Dia-Gesic Meclofenamic Sulfinpyrazone  Arthritis Bayer Timed Release Aspirin Diclofenac Meclomen Sulindac  Arthritis pain formula Anacin Dicumarol Medipren Supac  Analgesic (Safety coated) Arthralgen Diffunasal Mefanamic Suprofen  Arthritis Strength Bufferin Dihydrocodeine Mepro Compound Suprol  Arthropan liquid Dopirydamole Methcarbomol with Aspirin Synalgos  ASA tablets/Enseals Disalcid Micrainin Tagament  Ascriptin Doan's Midol Talwin  Ascriptin A/D Dolene Mobidin Tanderil  Ascriptin Extra Strength Dolobid Moblgesic Ticlid  Ascriptin with Codeine Doloprin or Doloprin with Codeine Momentum Tolectin  Asperbuf Duoprin Mono-gesic Trendar  Aspergum Duradyne Motrin or Motrin IB Triminicin  Aspirin plain, buffered or enteric coated Durasal Myochrisine Trigesic  Aspirin Suppositories Easprin Nalfon Trillsate  Aspirin with Codeine Ecotrin Regular or Extra Strength Naprosyn Uracel  Atromid-S Efficin Naproxen Ursinus  Auranofin Capsules Elmiron Neocylate Vanquish  Axotal Emagrin Norgesic Verin  Azathioprine Empirin or Empirin with Codeine Normiflo Vitamin E  Azolid Emprazil Nuprin Voltaren  Bayer Aspirin plain, buffered or children's or timed BC Tablets or powders Encaprin Orgaran Warfarin Sodium  Buff-a-Comp Enoxaparin Orudis Zorpin  Buff-a-Comp with Codeine Equegesic Os-Cal-Gesic   Buffaprin Excedrin plain, buffered or Extra Strength Oxalid   Bufferin Arthritis Strength Feldene Oxphenbutazone   Bufferin plain or Extra Strength Feldene Capsules Oxycodone with Aspirin   Bufferin with Codeine Fenoprofen Fenoprofen Pabalate or Pabalate-SF   Buffets II Flogesic Panagesic   Buffinol plain or Extra Strength Florinal or Florinal with Codeine Panwarfarin   Buf-Tabs Flurbiprofen Penicillamine   Butalbital Compound Four-way  cold tablets Penicillin   Butazolidin Fragmin Pepto-Bismol   Carbenicillin Geminisyn Percodan   Carna Arthritis Reliever Geopen Persantine   Carprofen Gold's salt Persistin   Chloramphenicol Goody's Phenylbutazone   Chloromycetin Haltrain Piroxlcam   Clmetidine heparin Plaquenil   Cllnoril Hyco-pap Ponstel   Clofibrate Hydroxy chloroquine Propoxyphen         Before stopping any of these medications, be sure to consult the physician who ordered them.  Some, such as Coumadin (Warfarin) are ordered to prevent or treat serious conditions such as "deep thrombosis", "pumonary embolisms", and other heart problems.  The amount of time that you may need off of the medication may also vary with the medication and the reason for which you were taking it.  If you are taking any of these medications, please make sure you notify your pain physician before you undergo any procedures.

## 2021-11-12 NOTE — Assessment & Plan Note (Signed)
Status post right L3 lumbar sympathetic nerve block that provided some pain relief.  Discussed repeating as usually a series of lumbar sympathetic nerve blocks are recommended.  I also discussed spinal cord stimulation with her however the patient prefers to repeat right L3 LSB.  Risk and benefits reviewed and patient would like to proceed.

## 2021-11-12 NOTE — Progress Notes (Signed)
Safety precautions to be maintained throughout the outpatient stay will include: orient to surroundings, keep bed in low position, maintain call bell within reach at all times, provide assistance with transfer out of bed and ambulation.  

## 2021-11-12 NOTE — Assessment & Plan Note (Signed)
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  She is currently on short-term disability.  She is on her feet for 10 hours of the day.  I do not think that she will be able to return to work in that capacity.  I have recommended that she look for a desk job that does not require her to be on her feet for such a long period of time.  I think this will be better in the overall management of her CRPS.

## 2021-11-13 ENCOUNTER — Ambulatory Visit: Payer: Managed Care, Other (non HMO) | Admitting: Podiatry

## 2021-11-13 DIAGNOSIS — G90521 Complex regional pain syndrome I of right lower limb: Secondary | ICD-10-CM | POA: Diagnosis not present

## 2021-11-13 DIAGNOSIS — G709 Myoneural disorder, unspecified: Secondary | ICD-10-CM | POA: Diagnosis not present

## 2021-11-15 NOTE — Progress Notes (Signed)
  Subjective:  Patient ID: Sue Cook, female    DOB: 1980/11/17,  MRN: [id number]  Chief Complaint  Patient presents with   Foot Pain    DOS: 12/11/2020 Procedure: Right peroneal tendon repair with tenosynovectomy  41 y.o. female presents with same complaint of foot being locked up.  She is states she went to go see Dr. Zollie Scale for pain management.  She states that she is doing a lot better.  The injection did help.   Review of Systems: Negative except as noted in the HPI. Denies N/V/F/Ch.  Past Medical History:  Diagnosis Date   Ankle pain 05/2016   RIGHT-PT SEEING DR Vickki Muff AND GETTING CORTISONE INJECTIONS   Complication of anesthesia    GERD (gastroesophageal reflux disease)    rare-NO MEDS   Headache    MIGRAINES   PONV (postoperative nausea and vomiting)     Current Outpatient Medications:    cholecalciferol (VITAMIN D3) 25 MCG (1000 UNIT) tablet, Take 1,000 Units by mouth daily., Disp: , Rfl:    cyanocobalamin 2000 MCG tablet, Take 2,000 mcg by mouth daily., Disp: , Rfl:    Multiple Vitamin (MULTIVITAMIN WITH MINERALS) TABS tablet, Take 1 tablet by mouth daily., Disp: , Rfl:    SUMAtriptan (IMITREX) 100 MG tablet, , Disp: , Rfl: 0  Social History   Tobacco Use  Smoking Status Never   Passive exposure: Never  Smokeless Tobacco Never    No Known Allergies Objective:  There were no vitals filed for this visit. There is no height or weight on file to calculate BMI. Constitutional Well developed. Well nourished.  Vascular Foot warm and well perfused. Capillary refill normal to all digits.   Neurologic Normal speech. Oriented to person, place, and time. Epicritic sensation to light touch grossly present bilaterally.  Dermatologic No pain along the course of the surgical site/peroneal tendon.  Some weakness noted to the peroneal tendon.  Generalized pain in the leg and the lower extremity noted.  Unable to recreate the locked of the foot.  She  Orthopedic: Mild  tenderness to palpation noted about the surgical site.   Radiographs: None Assessment:   No diagnosis found.         Plan:  Patient was evaluated and treated and all questions answered.  S/p foot surgery right -Clinically doing well from the surgical site standpoint however her foot is still hurting and it is very frustrating for her not to be able to figure out.  Right foot locking up/neuromuscular issues/CRPS -I explained the patient the etiology of neuromuscular disease/CRPS and worse treatment options were discussed. -Continue physical therapy -Patient is being primarily managed at the pain management for CRPS.  The block is helping with her pain. -At this time patient will benefit from long-term disability.  We will plan on extending her short-term disability out another 12 weeks.  No follow-ups on file.

## 2021-11-21 ENCOUNTER — Telehealth: Payer: Self-pay | Admitting: Student in an Organized Health Care Education/Training Program

## 2021-11-21 NOTE — Telephone Encounter (Signed)
I advised patient that the paperwork has not arrived to our office by fax. I also advised her that we do not complete disability forms, but will fax notes. She states that is ok, and that she will ask them to fax again.

## 2021-11-21 NOTE — Telephone Encounter (Signed)
PT wants to know if doctor Holley Raring has filled out her paper work that was faxed over by [place]. PT asked if someone could please give her a call. Thanks

## 2021-11-28 ENCOUNTER — Ambulatory Visit
Admission: RE | Admit: 2021-11-28 | Discharge: 2021-11-28 | Disposition: A | Discharge status: Home / Self Care | Payer: Managed Care, Other (non HMO) | Source: Ambulatory Visit | Attending: Student in an Organized Health Care Education/Training Program | Admitting: Student in an Organized Health Care Education/Training Program

## 2021-11-28 ENCOUNTER — Ambulatory Visit
Payer: Managed Care, Other (non HMO) | Attending: Student in an Organized Health Care Education/Training Program | Admitting: Student in an Organized Health Care Education/Training Program

## 2021-11-28 ENCOUNTER — Encounter: Payer: Self-pay | Admitting: Student in an Organized Health Care Education/Training Program

## 2021-11-28 DIAGNOSIS — G90521 Complex regional pain syndrome I of right lower limb: Secondary | ICD-10-CM | POA: Insufficient documentation

## 2021-11-28 MED ORDER — MIDAZOLAM HCL 2 MG/2ML IJ SOLN
INTRAMUSCULAR | Status: AC
  Filled 2021-11-28: qty 2

## 2021-11-28 MED ORDER — DEXAMETHASONE SODIUM PHOSPHATE 10 MG/ML IJ SOLN
INTRAMUSCULAR | Status: AC
  Filled 2021-11-28: qty 1

## 2021-11-28 MED ORDER — BUPIVACAINE-EPINEPHRINE (PF) 0.25% -1:200000 IJ SOLN
INTRAMUSCULAR | Status: AC
  Filled 2021-11-28: qty 30

## 2021-11-28 MED ORDER — BUPIVACAINE-EPINEPHRINE (PF) 0.25% -1:200000 IJ SOLN
30.0000 mL | Freq: Once | INTRAMUSCULAR | Status: AC
  Administered 2021-11-28: 30 mL via PERINEURAL

## 2021-11-28 MED ORDER — LIDOCAINE HCL 2 % IJ SOLN
INTRAMUSCULAR | Status: AC
  Filled 2021-11-28: qty 20

## 2021-11-28 MED ORDER — DEXAMETHASONE SODIUM PHOSPHATE 10 MG/ML IJ SOLN
10.0000 mg | Freq: Once | INTRAMUSCULAR | Status: AC
  Administered 2021-11-28: 10 mg

## 2021-11-28 MED ORDER — IOHEXOL 180 MG/ML  SOLN
10.0000 mL | Freq: Once | INTRAMUSCULAR | Status: AC
  Administered 2021-11-28: 10 mL via EPIDURAL
  Filled 2021-11-28: qty 20

## 2021-11-28 MED ORDER — ROPIVACAINE HCL 2 MG/ML IJ SOLN
INTRAMUSCULAR | Status: AC
  Filled 2021-11-28: qty 20

## 2021-11-28 NOTE — Progress Notes (Signed)
PROVIDER NOTE: Interpretation of information contained herein should be left to medically-trained personnel. Specific patient instructions are provided elsewhere under "Patient Instructions" section of medical record. This document was created in part using STT-dictation technology, any transcriptional errors that may result from this process are unintentional.  Patient: Sue Cook Type: Established DOB: [birth year] MRN: [id number] PCP: Kandyce Rud, MD  Service: Procedure DOS: 11/28/2021 Setting: Ambulatory Location: Ambulatory outpatient facility Delivery: Face-to-face Provider: Edward Jolly, MD Specialty: Interventional Pain Management Specialty designation: 09 Location: Outpatient facility Ref. Prov.: Edward Jolly, MD    Primary Reason for Visit: Interventional Pain Management Treatment. CC: Ankle Pain and Knee Pain (Right)    Procedure:          Anesthesia, Analgesia, Anxiolysis:  Type: Therapeutic Lumbar Sympathetic Block #2  Region:Lumbosacral Level: L3 Laterality: Right-Sided Paravertebral  Anesthesia: Local (1-2% Lidocaine)  Anxiolysis: IV Versed x 2 mg (minimal)  Guidance: Fluoroscopy           Position: Prone with head of the table was raised to facilitate breathing.   1. Complex regional pain syndrome type 1 of right lower extremity    NAS-11 Pain score:   Pre-procedure: 2 /10   Post-procedure: 0-No pain/10     Pre-op H&P Assessment:  Sue Cook is a 41 y.o. (year old), female patient, seen today for interventional treatment. She  has a past surgical history that includes Abdominal hysterectomy; Wisdom tooth extraction; Endometrial biopsy; Cholecystectomy (N/A, 06/11/2016); Foot surgery (Right); and Carpal tunnel release (Bilateral). Sue Cook has a current medication list which includes the following prescription(s): cholecalciferol, cyanocobalamin, multivitamin with minerals, and sumatriptan. Her primarily concern today is the Ankle Pain and Knee Pain  (Right)  Initial Vital Signs:  Pulse/HCG Rate: 83ECG Heart Rate: 77 Temp: 97.7 F (36.5 C) Resp: 17 BP: 133/80 SpO2: 98 %  BMI: Estimated body mass index is 39.94 kg/m as calculated from the following:   Height as of this encounter: 5\' 5"  (1.651 m).   Weight as of this encounter: 240 lb (108.9 kg).  Risk Assessment: Allergies: Reviewed. She has No Known Allergies.  Allergy Precautions: None required Coagulopathies: Reviewed. None identified.  Blood-thinner therapy: None at this time Active Infection(s): Reviewed. None identified. Sue Cook is afebrile  Site Confirmation: Sue Cook was asked to confirm the procedure and laterality before marking the site Procedure checklist: Completed Consent: Before the procedure and under the influence of no sedative(s), amnesic(s), or anxiolytics, the patient was informed of the treatment options, risks and possible complications. To fulfill our ethical and legal obligations, as recommended by the American Medical Association's Code of Ethics, I have informed the patient of my clinical impression; the nature and purpose of the treatment or procedure; the risks, benefits, and possible complications of the intervention; the alternatives, including doing nothing; the risk(s) and benefit(s) of the alternative treatment(s) or procedure(s); and the risk(s) and benefit(s) of doing nothing. The patient was provided information about the general risks and possible complications associated with the procedure. These may include, but are not limited to: failure to achieve desired goals, infection, bleeding, organ or nerve damage, allergic reactions, paralysis, and death. In addition, the patient was informed of those risks and complications associated to Spine-related procedures, such as failure to decrease pain; infection (i.e.: Meningitis, epidural or intraspinal abscess); bleeding (i.e.: epidural hematoma, subarachnoid hemorrhage, or any other type of  intraspinal or peri-dural bleeding); organ or nerve damage (i.e.: Any type of peripheral nerve, nerve root, or spinal cord injury) with subsequent damage  to sensory, motor, and/or autonomic systems, resulting in permanent pain, numbness, and/or weakness of one or several areas of the body; allergic reactions; (i.e.: anaphylactic reaction); and/or death. Furthermore, the patient was informed of those risks and complications associated with the medications. These include, but are not limited to: allergic reactions (i.e.: anaphylactic or anaphylactoid reaction(s)); adrenal axis suppression; blood sugar elevation that in diabetics may result in ketoacidosis or comma; water retention that in patients with history of congestive heart failure may result in shortness of breath, pulmonary edema, and decompensation with resultant heart failure; weight gain; swelling or edema; medication-induced neural toxicity; particulate matter embolism and blood vessel occlusion with resultant organ, and/or nervous system infarction; and/or aseptic necrosis of one or more joints. Finally, the patient was informed that Medicine is not an exact science; therefore, there is also the possibility of unforeseen or unpredictable risks and/or possible complications that may result in a catastrophic outcome. The patient indicated having understood very clearly. We have given the patient no guarantees and we have made no promises. Enough time was given to the patient to ask questions, all of which were answered to the patient's satisfaction. Sue Cook has indicated that she wanted to continue with the procedure. Attestation: I, the ordering provider, attest that I have discussed with the patient the benefits, risks, side-effects, alternatives, likelihood of achieving goals, and potential problems during recovery for the procedure that I have provided informed consent. Date  Time: 11/28/2021  9:13 AM  Pre-Procedure Preparation:  Monitoring:  As per clinic protocol. Respiration, ETCO2, SpO2, BP, heart rate and rhythm monitor placed and checked for adequate function Safety Precautions: Patient was assessed for positional comfort and pressure points before starting the procedure. Time-out: I initiated and conducted the "Time-out" before starting the procedure, as per protocol. The patient was asked to participate by confirming the accuracy of the "Time Out" information. Verification of the correct person, site, and procedure were performed and confirmed by me, the nursing staff, and the patient. "Time-out" conducted as per Joint Commission's Universal Protocol (UP.01.01.01). Time: 0939  Description of Procedure:          Target Area: For Lumbar Sympathetic Block(s), the target is the anterolateral aspect of the L3 vertebral bodies, where the lumbar sympathetic chain resides. Approach: Paravertebral, ipsilateral approach. Area Prepped: Entire Posterior Thoracolumbar Region DuraPrep (Iodine Povacrylex [0.7% available iodine] and Isopropyl Alcohol, 74% w/w) Safety Precautions: Aspiration looking for blood return was conducted prior to all injections. At no point did we inject any substances, as a needle was being advanced. No attempts were made at seeking any paresthesias. Safe injection practices and needle disposal techniques used. Medications properly checked for expiration dates. SDV (single dose vial) medications used. Description of the Procedure: Protocol guidelines were followed. The patient was placed in position over the procedure table. The target area was identified and the area prepped in the usual manner. Skin & deeper tissues infiltrated with local anesthetic. Appropriate amount of time allowed to pass for local anesthetics to take effect. The procedure needles were then advanced to the target area, the superior anterolateral border of the L3 vertebral body, under pulsed fluoroscopic guidance. Care was taken not to advance the tip of  the needle past the anterior border of the vertebral body, on the lateral fluoroscopic view. Proper needle placement secured. Negative aspiration confirmed. Solution injected in intermittent fashion, asking for systemic symptoms every 0.5cc of injectate. The needles were then removed and the area cleansed, making sure to leave some of  the prepping solution back to take advantage of its long term bactericidal properties. Vitals:   11/28/21 0953 11/28/21 0958 11/28/21 1004 11/28/21 1005  BP: (!) 132/92 (!) 122/103 114/62   Pulse:   81   Resp: 18 (!) 22 16   Temp:   (!) 97.2 F (36.2 C)   TempSrc:   Oral Skin  SpO2: 98% 97% 99%   Weight:      Height:        Start Time: 0939 hrs. End Time: 0957 hrs. Materials:  Needle(s) Type: Spinal Needle Gauge: 22G Length: 7-in Medication(s): Please see orders for medications and dosing details. 15 cc solution made of 14 cc of 0.25% bupivacaine with epinephrine, 1 cc of Decadron 10 mg/cc.  Injected in 5 cc aliquots while patient was monitored to ensure no adverse side effects or complications.  Patient tolerated lumbar sympathetic block well.  Her right lower extremity temperature from her foot increased from 91.2--->93.8 degrees F after her block. Imaging Guidance (Spinal):          Type of Imaging Technique: Fluoroscopy Guidance (Spinal) Indication(s): Assistance in needle guidance and placement for procedures requiring needle placement in or near specific anatomical locations not easily accessible without such assistance. Exposure Time: Please see nurses notes. Contrast: Before injecting any contrast, we confirmed that the patient did not have an allergy to iodine, shellfish, or radiological contrast. Once satisfactory needle placement was completed at the desired level, radiological contrast was injected. Contrast injected under live fluoroscopy. No contrast complications. See chart for type and volume of contrast used. Fluoroscopic Guidance: I was  personally present during the use of fluoroscopy. "Tunnel Vision Technique" used to obtain the best possible view of the target area. Parallax error corrected before commencing the procedure. "Direction-depth-direction" technique used to introduce the needle under continuous pulsed fluoroscopy. Once target was reached, antero-posterior, oblique, and lateral fluoroscopic projection used confirm needle placement in all planes. Images permanently stored in EMR. Interpretation: I personally interpreted the imaging intraoperatively. Adequate needle placement confirmed in multiple planes. Appropriate spread of contrast into desired area was observed. No evidence of afferent or efferent intravascular uptake. No intrathecal or subarachnoid spread observed. Permanent images saved into the patient's record.  Antibiotic Prophylaxis:   Anti-infectives (From admission, onward)    None      Indication(s): None identified  Post-operative Assessment:  Post-procedure Vital Signs:  Pulse/HCG Rate: 8188 Temp:  (left foot  89.7  right foot 93.8) Resp: 16 BP: 114/62 SpO2: 99 %  EBL: None  Complications: No immediate post-treatment complications observed by team, or reported by patient.  Note: The patient tolerated the entire procedure well. A repeat set of vitals were taken after the procedure and the patient was kept under observation following institutional policy, for this type of procedure. Post-procedural neurological assessment was performed, showing return to baseline, prior to discharge. The patient was provided with post-procedure discharge instructions, including a section on how to identify potential problems. Should any problems arise concerning this procedure, the patient was given instructions to immediately contact us, at any time, without hesitation. In any case, we plan to contact the patient by telephone for a follow-up status report regarding this interventional procedure.  Comments:  No  additional relevant information.  Plan of Care  Orders:  Orders Placed This Encounter  Procedures   DG PAIN CL[facility] C-ARM 1-60 MIN NO REPORT    Intraoperative interpretation by procedural physician at [place].    Standing Status:   Standing  Number of Occurrences:   1    Order Specific Question:   Reason for exam:    Answer:   Assistance in needle guidance and placement for procedures requiring needle placement in or near specific anatomical locations not easily accessible without such assistance.     Medications ordered for procedure: Meds ordered this encounter  Medications   bupivacaine-epinephrine (PF) (MARCAINE W/ EPI) 0.25% -1:200000 injection 30 mL   dexamethasone (DECADRON) injection 10 mg   iohexol (OMNIPAQUE) 180 MG/ML injection 10 mL    Must be Myelogram-compatible. If not available, you may substitute with a water-soluble, non-ionic, hypoallergenic, myelogram-compatible radiological contrast medium.   Medications administered: We administered bupivacaine-epinephrine (PF), dexamethasone, and iohexol.  See the medical record for exact dosing, route, and time of administration.  Follow-up plan:   Return in about 6 weeks (around 01/09/2022).       Right L3 LSB 10/24/21, 11/28/21    Recent Visits Date Type Provider Dept  11/12/21 Office Visit Gillis Santa, MD [facility]  10/24/21 Procedure visit Gillis Santa, MD [facility]  10/11/21 Office Visit Gillis Santa, MD [facility]  Showing recent visits within past 90 days and meeting all other requirements Today's Visits Date Type Provider Dept  11/28/21 Procedure visit Gillis Santa, MD [facility]  Showing today's visits and meeting all other requirements Future Appointments Date Type Provider Dept  01/09/22 Appointment Gillis Santa, MD [facility]  Showing future appointments within next 90 days and meeting all other requirements  Disposition:  Discharge home  Discharge (Date  Time): 11/28/2021; 1007 hrs.   Primary Care Physician: Derinda Late, MD Location: [facility] Note by: Gillis Santa, MD Date: 11/28/2021; Time: 10:46 AM  Disclaimer:  Medicine is not an exact science. The only guarantee in medicine is that nothing is guaranteed. It is important to note that the decision to proceed with this intervention was based on the information collected from the patient. The Data and conclusions were drawn from the patient's questionnaire, the interview, and the physical examination. Because the information was provided in large part by the patient, it cannot be guaranteed that it has not been purposely or unconsciously manipulated. Every effort has been made to obtain as much relevant data as possible for this evaluation. It is important to note that the conclusions that lead to this procedure are derived in large part from the available data. Always take into account that the treatment will also be dependent on availability of resources and existing treatment guidelines, considered by other Pain Management Practitioners as being common knowledge and practice, at the time of the intervention. For Medico-Legal purposes, it is also important to point out that variation in procedural techniques and pharmacological choices are the acceptable norm. The indications, contraindications, technique, and results of the above procedure should only be interpreted and judged by a Board-Certified Interventional Pain Specialist with extensive familiarity and expertise in the same exact procedure and technique.

## 2021-11-28 NOTE — Patient Instructions (Signed)

## 2021-11-28 NOTE — Progress Notes (Signed)
Safety precautions to be maintained throughout the outpatient stay will include: orient to surroundings, keep bed in low position, maintain call bell within reach at all times, provide assistance with transfer out of bed and ambulation.  

## 2021-11-29 ENCOUNTER — Telehealth: Payer: Self-pay

## 2021-12-21 ENCOUNTER — Telehealth: Payer: Self-pay | Admitting: Student in an Organized Health Care Education/Training Program

## 2021-12-21 NOTE — Telephone Encounter (Signed)
Called patinet and informed her that I didn't know of any reason that she could not have a massage after having a nerve block. LM

## 2021-12-21 NOTE — Telephone Encounter (Signed)
Patient wants to know if she can get a massage after having the nerve block. Please advise patient

## 2022-01-09 ENCOUNTER — Encounter: Payer: Self-pay | Admitting: Student in an Organized Health Care Education/Training Program

## 2022-01-09 ENCOUNTER — Ambulatory Visit
Payer: Managed Care, Other (non HMO) | Attending: Student in an Organized Health Care Education/Training Program | Admitting: Student in an Organized Health Care Education/Training Program

## 2022-01-09 VITALS — BP 132/81 | HR 80 | Temp 97.3°F | Resp 14 | Ht 65.0 in | Wt 245.0 lb

## 2022-01-09 DIAGNOSIS — G894 Chronic pain syndrome: Secondary | ICD-10-CM | POA: Diagnosis present

## 2022-01-09 DIAGNOSIS — M792 Neuralgia and neuritis, unspecified: Secondary | ICD-10-CM

## 2022-01-09 DIAGNOSIS — G90521 Complex regional pain syndrome I of right lower limb: Secondary | ICD-10-CM | POA: Diagnosis present

## 2022-01-09 NOTE — Patient Instructions (Signed)
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 - we will give you [facility] Brochure to schedule for psych eval -please have them fax [place] results -AFTER you have completed your [facility] eval, please contact clinic and I'll place order for SCS trial

## 2022-01-09 NOTE — Progress Notes (Signed)
PROVIDER NOTE: Information contained herein reflects review and annotations entered in association with encounter. Interpretation of such information and data should be left to medically-trained personnel. Information provided to patient can be located elsewhere in the medical record under "Patient Instructions". Document created using STT-dictation technology, any transcriptional errors that may result from process are unintentional.    Patient: Sue Cook  Service Category: E/M  Provider: Gillis Santa, MD  DOB: 14-Jan-1981  DOS: 01/09/2022  Referring Provider: Derinda Late, MD  MRN: [id number]  Specialty: Interventional Pain Management  PCP: Derinda Late, MD  Type: Established Patient  Setting: Ambulatory outpatient    Location: Office  Delivery: Face-to-face     HPI  Ms. Sue Cook, a 41 y.o. year old female, is here today because of her Complex regional pain syndrome type 1 of right lower extremity [G90.521]. Ms. Sue Cook's primary complain today is Ankle Pain (right) Last encounter: My last encounter with her was on 12/21/2021. Pertinent problems: Ms. Sue Cook has Complex regional pain syndrome type 1 of right lower extremity; Chronic pain syndrome; Chronic pain of right knee; and Primary osteoarthritis of right knee on their pertinent problem list. Pain Assessment: Severity of Chronic pain is reported as a 2 /10. Location: Ankle Right/to calf. Onset: More than a month ago. Quality: Constant. Timing: Constant. Modifying factor(s): procedures. Vitals:  height is _0  (1.651 m) and weight is 245 lb (111.1 kg). Her temporal temperature is 97.3 F (36.3 C) (abnormal). Her blood pressure is 132/81 and her pulse is 80. Her respiration is 14 and oxygen saturation is 100%.  BMI: Estimated body mass index is 40.77 kg/m as calculated from the following:   Height as of this encounter: _1  (1.651 m).   Weight as of this encounter: 245 lb (111.1 kg).  Reason for encounter: post-procedure  evaluation and assessment.   HPI from initial clinic visit: Sue Cook is a pleasant 41 year old female who presents with a chief complaint of right foot pain believed to be related to complex regional pain syndrome.  She is being referred by Dr. Posey Cook.  Of note she has a history of right foot surgery.  MRI of her right ankle showed mild osteoarthritis.  She states that her foot surgery was not helpful.  She has been having difficulty managing her pain.  She does endorse numbness and tingling, redness, difficulty with movement and excruciating pain at times of her right foot.  She has had cortisone injections in her right foot with limited response.  She is currently on short-term disability.  Of note her foot surgery was November 2022 in which Dr. Posey Cook reportedly repaired a tendon, placed a graft and fixed the ligament.  She had recurrence of her symptoms thereafter.  She lost her job given inability to participate in work activities.  She has taken gabapentin in the past.  She has tried Effexor as well but had sedation and mood changes.  She has not tried Lyrica.  She is not interested in medication management per se.  I discussed interventional options with her for CRPS which include physical therapy, cognitive behavioral therapy, tricyclic antidepressants, anticonvulsants including gabapentin Lyrica, lumbar sympathetic nerve block, spinal cord stimulation.  This was discussed with the patient in great detail.  Risks of each therapeutic option was reviewed with patient and her husband.  They would like to proceed with lumbar sympathetic nerve block.  We will plan on doing this with sedation under fluoroscopy.  If this is not effective, we can consider spinal  cord stimulation for CRPS.   Post-procedure evaluation    Procedure:          Anesthesia, Analgesia, Anxiolysis:  Type: Therapeutic Lumbar Sympathetic Block #2  Region:Lumbosacral Level: L3 Laterality: Right-Sided Paravertebral  Anesthesia: Local  (1-2% Lidocaine)  Anxiolysis: IV Versed x 2 mg (minimal)  Guidance: Fluoroscopy           Position: Prone with head of the table was raised to facilitate breathing.   1. Complex regional pain syndrome type 1 of right lower extremity    NAS-11 Pain score:   Pre-procedure: 2 /10   Post-procedure: 0-No pain/10      Effectiveness:  Initial hour after procedure: 100 %  Subsequent 4-6 hours post-procedure: 100 %  Analgesia past initial 6 hours: 90 % (lasted "few weeks")  Ongoing improvement:  Analgesic:  60-70% Function: Ms. Brekke reports improvement in function ROM: Ms. Sue Cook reports improvement in ROM   ROS  Constitutional: Denies any fever or chills Gastrointestinal: No reported hemesis, hematochezia, vomiting, or acute GI distress Musculoskeletal: Denies any acute onset joint swelling, redness, loss of ROM, or weakness Neurological:  Right lower extremity neuropathic pain  Medication Review  SUMAtriptan, cholecalciferol, cyanocobalamin, and multivitamin with minerals  History Review  Allergy: Ms. Sue Cook has No Known Allergies. Drug: Ms. Sue Cook  reports no history of drug use. Alcohol:  reports current alcohol use. Tobacco:  reports that she has never smoked. She has never been exposed to tobacco smoke. She has never used smokeless tobacco. Social: Ms. Sue Cook  reports that she has never smoked. She has never been exposed to tobacco smoke. She has never used smokeless tobacco. She reports current alcohol use. She reports that she does not use drugs. Medical:  has a past medical history of Ankle pain (41/9622), Complication of anesthesia, GERD (gastroesophageal reflux disease), Headache, and PONV (postoperative nausea and vomiting). Surgical: Ms. Sue Cook  has a past surgical history that includes Abdominal hysterectomy; Wisdom tooth extraction; Endometrial biopsy; Cholecystectomy (N/A, 06/11/2016); Foot surgery (Right); and Carpal tunnel release (Bilateral). Family: family  history includes Breast cancer in her maternal aunt and maternal grandmother; Cancer in her mother; Diabetes in her sister; Heart attack in her father, maternal grandfather, maternal grandmother, and paternal grandfather.  Laboratory Chemistry Profile   Renal Lab Results  Component Value Date   BUN 18 03/27/2021   CREATININE 0.82 03/27/2021   BCR 22 03/27/2021   GFRAA 113 02/11/2018   GFRNONAA 98 02/11/2018    Hepatic Lab Results  Component Value Date   AST 12 02/11/2018   ALT 20 02/11/2018   ALBUMIN 4.7 02/11/2018   ALKPHOS 66 02/11/2018    Electrolytes Lab Results  Component Value Date   NA 141 03/27/2021   K 4.4 03/27/2021   CL 101 03/27/2021   CALCIUM 9.4 03/27/2021   MG 2.1 03/27/2021    Bone Lab Results  Component Value Date   VD25OH 34.5 03/27/2021    Inflammation (CRP: Acute Phase) (ESR: Chronic Phase) No results found for: "CRP", "ESRSEDRATE", "LATICACIDVEN"       Note: Above Lab results reviewed.   Physical Exam  General appearance: Well nourished, well developed, and well hydrated. In no apparent acute distress Mental status: Alert, oriented x 3 (person, place, & time)       Respiratory: No evidence of acute respiratory distress Eyes: PERLA Vitals: BP 132/81   Pulse 80   Temp (!) 97.3 F (36.3 C) (Temporal)   Resp 14   Ht _0  (1.651 m)  Wt 245 lb (111.1 kg)   SpO2 100%   BMI 40.77 kg/m  BMI: Estimated body mass index is 40.77 kg/m as calculated from the following:   Height as of this encounter: _0  (1.651 m).   Weight as of this encounter: 245 lb (111.1 kg). Ideal: Ideal body weight: 57 kg (125 lb 10.6 oz) Adjusted ideal body weight: 78.7 kg (173 lb 6.4 oz)    Lumbar Spine Area Exam  Skin & Axial Inspection: No masses, redness, or swelling Alignment: Symmetrical Functional ROM: Unrestricted ROM       Stability: No instability detected Muscle Tone/Strength: Functionally intact. No obvious neuro-muscular anomalies detected. Sensory  (Neurological): Unimpaired   Gait & Posture Assessment  Ambulation: Unassisted Gait: Relatively normal for age and body habitus Posture: WNL    Lower Extremity Exam      Side: Right lower extremity   Side: Left lower extremity  Stability: No instability observed           Stability: No instability observed          Skin & Extremity Inspection: Evidence of prior arthroplastic surgery mild allodynia, pseudomotor changes, mild erythema consistent with CRPS   Skin & Extremity Inspection: Skin color, temperature, and hair growth are WNL. No peripheral edema or cyanosis. No masses, redness, swelling, asymmetry, or associated skin lesions. No contractures.  Functional ROM: Pain restricted ROM        foot and ankle           Functional ROM: Unrestricted ROM                  Muscle Tone/Strength: Functionally intact. No obvious neuro-muscular anomalies detected.   Muscle Tone/Strength: Functionally intact. No obvious neuro-muscular anomalies detected.  Sensory (Neurological): Neurogenic pain pattern      also musculoskeletal for right knee   Sensory (Neurological): Unimpaired        DTR: Patellar: deferred today Achilles: deferred today Plantar: deferred today   DTR: Patellar: deferred today Achilles: deferred today Plantar: deferred today  Palpation: No palpable anomalies   Palpation: No palpable anomalies  Dorsalis pedis and posterior tibial pulse intact  Assessment   Diagnosis Status  1. Complex regional pain syndrome type 1 of right lower extremity   2. Intractable neuropathic pain of right foot   3. Chronic pain syndrome    Controlled Controlled Controlled   Updated Problems: Problem  Intractable Neuropathic Pain of Right Foot    Plan of Care  Patient is status post 2 right lumbar sympathetic nerve blocks for CRPS of right foot.  These provided significant albeit short-term pain relief.  We discussed neck steps which could include repeating lumbar sympathetic nerve block versus  spinal cord stim trial for the management of CRPS.  We had an extensive discussion regarding both treatment modalities.  Patient would like to proceed with spinal cord stim trial.  I have ordered a psych eval which is required prior to approval.  Once the patient has completed her psych eval, I informed her to contact our clinic at which point I can place an order for a spinal cord stim trial with [employer].  She was provided resources for [employer].  She was also given antimicrobial solution to cleanse her back with prior to her tentative SCS trial date.  Follow-up plan:   Return for -AFTER you have completed your [facility] eval, please contact clinic and I'll place order for SCS trial.     Right L3 LSB 10/24/21,  11/28/21     Recent Visits Date Type Provider Dept  11/28/21 Procedure visit Gillis Santa, MD [facility]  11/12/21 Office Visit Gillis Santa, MD [facility]  10/24/21 Procedure visit Gillis Santa, MD [facility]  10/11/21 Office Visit Gillis Santa, MD [facility]  Showing recent visits within past 90 days and meeting all other requirements Today's Visits Date Type Provider Dept  01/09/22 Office Visit Gillis Santa, MD [facility]  Showing today's visits and meeting all other requirements Future Appointments No visits were found meeting these conditions. Showing future appointments within next 90 days and meeting all other requirements  I discussed the assessment and treatment plan with the patient. The patient was provided an opportunity to ask questions and all were answered. The patient agreed with the plan and demonstrated an understanding of the instructions.  Patient advised to call back or seek an in-person evaluation if the symptoms or condition worsens.  Duration of encounter: 42mnutes.  Total time on encounter, as per AMA guidelines included both the face-to-face and non-face-to-face time  personally spent by the physician and/or other qualified health care professional(s) on the day of the encounter (includes time in activities that require the physician or other qualified health care professional and does not include time in activities normally performed by clinical staff). Physician's time may include the following activities when performed: Preparing to see the patient (e.g., pre-charting review of records, searching for previously ordered imaging, lab work, and nerve conduction tests) Review of prior analgesic pharmacotherapies. Reviewing PMP Interpreting ordered tests (e.g., lab work, imaging, nerve conduction tests) Performing post-procedure evaluations, including interpretation of diagnostic procedures Obtaining and/or reviewing separately obtained history Performing a medically appropriate examination and/or evaluation Counseling and educating the patient/family/caregiver Ordering medications, tests, or procedures Referring and communicating with other health care professionals (when not separately reported) Documenting clinical information in the electronic or other health record Independently interpreting results (not separately reported) and communicating results to the patient/ family/caregiver Care coordination (not separately reported)  Note by: BGillis Santa MD Date: 01/09/2022; Time: 3:45 PM

## 2022-01-22 ENCOUNTER — Other Ambulatory Visit: Payer: Self-pay | Admitting: Student in an Organized Health Care Education/Training Program

## 2022-01-22 DIAGNOSIS — G90521 Complex regional pain syndrome I of right lower limb: Secondary | ICD-10-CM

## 2022-01-22 DIAGNOSIS — M792 Neuralgia and neuritis, unspecified: Secondary | ICD-10-CM

## 2022-01-22 NOTE — Treatment Plan (Signed)
Patient has completed her psych evaluation for spinal cord stimulator trial for CRPS management.  Please refer to previous clinic notes regarding assessment and plans.  Order placed for [employer] trial for CRPS.  Orders Placed This Encounter  Procedures   [place] representative to notify them of the scheduled case and to make sure they will be available to provide required equipment.    Standing Status:   Future    Standing Expiration Date:   04/23/2022    Scheduling Instructions:     Side: Bilateral     Level: Lumbar     Device:  [employer]     Sedation: With sedation     Timeframe: As soon as Emergency planning/management officer Question:   Where will this procedure be performed?    Answer:   [facility]

## 2022-02-05 ENCOUNTER — Ambulatory Visit: Payer: Managed Care, Other (non HMO) | Admitting: Podiatry

## 2022-02-08 ENCOUNTER — Ambulatory Visit: Payer: Managed Care, Other (non HMO) | Admitting: Podiatry

## 2022-02-08 ENCOUNTER — Encounter: Payer: Self-pay | Admitting: Podiatry

## 2022-02-08 VITALS — BP 124/68

## 2022-02-08 DIAGNOSIS — G709 Myoneural disorder, unspecified: Secondary | ICD-10-CM

## 2022-02-08 DIAGNOSIS — G90521 Complex regional pain syndrome I of right lower limb: Secondary | ICD-10-CM

## 2022-02-08 NOTE — Progress Notes (Signed)
Subjective:  Patient ID: Sue Cook, female    DOB: 1980/06/10,  MRN: [id number]  Chief Complaint  Patient presents with   Foot Pain    Pt stated that she is making improvements but still has some discomfort     DOS: 12/11/2020 Procedure: Right peroneal tendon repair with tenosynovectomy  42 y.o. female presents with same complaint of foot being locked up.  She is states she went to go see Dr. Zollie Scale for pain management.  She states that she is doing a lot better.  The injection did help.   Review of Systems: Negative except as noted in the HPI. Denies N/V/F/Ch.  Past Medical History:  Diagnosis Date   Ankle pain 05/2016   RIGHT-PT SEEING DR Vickki Muff AND GETTING CORTISONE INJECTIONS   Complication of anesthesia    GERD (gastroesophageal reflux disease)    rare-NO MEDS   Headache    MIGRAINES   PONV (postoperative nausea and vomiting)     Current Outpatient Medications:    buPROPion (WELLBUTRIN XL) 150 MG 24 hr tablet, Take by mouth. (Patient not taking: Reported on 02/11/2022), Disp: , Rfl:    Semaglutide-Weight Management 0.5 MG/0.5ML SOAJ, Inject into the skin. (Patient not taking: Reported on 02/11/2022), Disp: , Rfl:    cephALEXin (KEFLEX) 500 MG capsule, Take 1 capsule (500 mg total) by mouth 4 (four) times daily for 7 days., Disp: 28 capsule, Rfl: 0   cholecalciferol (VITAMIN D3) 25 MCG (1000 UNIT) tablet, Take 1,000 Units by mouth daily. (Patient not taking: Reported on 02/11/2022), Disp: , Rfl:    cyanocobalamin 2000 MCG tablet, Take 2,000 mcg by mouth daily. (Patient not taking: Reported on 02/11/2022), Disp: , Rfl:    Multiple Vitamin (MULTIVITAMIN WITH MINERALS) TABS tablet, Take 1 tablet by mouth daily. (Patient not taking: Reported on 02/11/2022), Disp: , Rfl:    SUMAtriptan (IMITREX) 100 MG tablet, , Disp: , Rfl: 0  Social History   Tobacco Use  Smoking Status Never   Passive exposure: Never  Smokeless Tobacco Never    No Known Allergies Objective:   Vitals:    02/08/22 0953  BP: 124/68   There is no height or weight on file to calculate BMI. Constitutional Well developed. Well nourished.  Vascular Foot warm and well perfused. Capillary refill normal to all digits.   Neurologic Normal speech. Oriented to person, place, and time. Epicritic sensation to light touch grossly present bilaterally.  Dermatologic No pain along the course of the surgical site/peroneal tendon.  Some weakness noted to the peroneal tendon.  Generalized pain in the leg and the lower extremity noted.  Unable to recreate the locked of the foot.  She  Orthopedic: Mild tenderness to palpation noted about the surgical site.   Radiographs: None Assessment:   1. Complex regional pain syndrome type 1 of right lower extremity   2. Neuromuscular disease ([place])            Plan:  Patient was evaluated and treated and all questions answered.  S/p foot surgery right -Clinically doing well from the surgical site standpoint however her foot is still hurting and it is very frustrating for her not to be able to figure out.  Right foot locking up/neuromuscular issues/CRPS -I explained the patient the etiology of neuromuscular disease/CRPS and worse treatment options were discussed. -Continue physical therapy -Patient is being primarily managed at the pain management for CRPS.  The block is helping with her pain. -At this time patient will benefit from long-term disability.  We will plan on extending her short-term disability out another 12 weeks as she is getting a trial in place for CRPS.  No follow-ups on file.

## 2022-02-11 ENCOUNTER — Ambulatory Visit
Payer: Managed Care, Other (non HMO) | Attending: Student in an Organized Health Care Education/Training Program | Admitting: Student in an Organized Health Care Education/Training Program

## 2022-02-11 ENCOUNTER — Encounter: Payer: Self-pay | Admitting: Student in an Organized Health Care Education/Training Program

## 2022-02-11 ENCOUNTER — Ambulatory Visit
Admission: RE | Admit: 2022-02-11 | Discharge: 2022-02-11 | Disposition: A | Discharge status: Home / Self Care | Payer: Managed Care, Other (non HMO) | Source: Ambulatory Visit | Attending: Student in an Organized Health Care Education/Training Program | Admitting: Student in an Organized Health Care Education/Training Program

## 2022-02-11 VITALS — BP 111/77 | HR 75 | Temp 97.3°F | Resp 14 | Ht 65.0 in | Wt 250.0 lb

## 2022-02-11 DIAGNOSIS — G894 Chronic pain syndrome: Secondary | ICD-10-CM | POA: Diagnosis present

## 2022-02-11 DIAGNOSIS — G90521 Complex regional pain syndrome I of right lower limb: Secondary | ICD-10-CM | POA: Diagnosis not present

## 2022-02-11 DIAGNOSIS — M792 Neuralgia and neuritis, unspecified: Secondary | ICD-10-CM | POA: Diagnosis not present

## 2022-02-11 MED ORDER — LIDOCAINE HCL 2 % IJ SOLN
INTRAMUSCULAR | Status: AC
  Filled 2022-02-11: qty 20

## 2022-02-11 MED ORDER — ROPIVACAINE HCL 2 MG/ML IJ SOLN
9.0000 mL | Freq: Once | INTRAMUSCULAR | Status: AC
  Administered 2022-02-11: 9 mL via PERINEURAL

## 2022-02-11 MED ORDER — FENTANYL CITRATE (PF) 100 MCG/2ML IJ SOLN
INTRAMUSCULAR | Status: AC
  Filled 2022-02-11: qty 2

## 2022-02-11 MED ORDER — MIDAZOLAM HCL 5 MG/5ML IJ SOLN
0.5000 mg | Freq: Once | INTRAMUSCULAR | Status: AC
  Administered 2022-02-11: 2 mg via INTRAVENOUS

## 2022-02-11 MED ORDER — ROPIVACAINE HCL 2 MG/ML IJ SOLN
INTRAMUSCULAR | Status: AC
  Filled 2022-02-11: qty 20

## 2022-02-11 MED ORDER — CEPHALEXIN 500 MG PO CAPS: 500.0000 mg | ORAL_CAPSULE | Freq: Four times a day (QID) | ORAL | 0 refills | Status: AC

## 2022-02-11 MED ORDER — ROPIVACAINE HCL 2 MG/ML IJ SOLN: 9.0000 mL | Freq: Once | INTRAMUSCULAR | Status: DC

## 2022-02-11 MED ORDER — MIDAZOLAM HCL 5 MG/5ML IJ SOLN
INTRAMUSCULAR | Status: AC
  Filled 2022-02-11: qty 5

## 2022-02-11 MED ORDER — CEFAZOLIN SODIUM 1 G IJ SOLR
INTRAMUSCULAR | Status: AC
  Filled 2022-02-11: qty 20

## 2022-02-11 MED ORDER — CEFAZOLIN SODIUM-DEXTROSE 2-4 GM/100ML-% IV SOLN
2.0000 g | Freq: Once | INTRAVENOUS | Status: AC
  Administered 2022-02-11: 2 g via INTRAVENOUS
  Filled 2022-02-11: qty 100

## 2022-02-11 MED ORDER — LIDOCAINE HCL 2 % IJ SOLN
20.0000 mL | Freq: Once | INTRAMUSCULAR | Status: AC
  Administered 2022-02-11: 400 mg

## 2022-02-11 MED ORDER — FENTANYL CITRATE (PF) 100 MCG/2ML IJ SOLN
25.0000 ug | INTRAMUSCULAR | Status: AC | PRN
  Administered 2022-02-11: 50 ug via INTRAVENOUS
  Administered 2022-02-11: 25 ug via INTRAVENOUS

## 2022-02-11 MED ORDER — LACTATED RINGERS IV SOLN: Freq: Once | INTRAVENOUS | Status: AC

## 2022-02-11 NOTE — Patient Instructions (Addendum)
Today we did the following -We have done a Spinal Cord Stimulator Trial with [employer]  -As long as the leads are in place, do not bathe or shower. You may sponge bathe.  -While the lead is in place, please limit the bending, lifting, or twisting because the lead can move.  -The things we want to see is if your pain improves (and by what percentage), if you can do more activity (don't overdo it), and if you can use less of your "as needed" medicine. Do not stop long acting medicines like methadone, oxycontin, MS Contin, etc without checking with [place].  -It is VERY important that you pick up the antibiotics we prescribed, Keflex, on your way home from the trial and take them as prescribed(4 times a day), starting today, for as long as the lead is in place.  -The Spina Cord Stimulator Representative will be in contact with you while the lead is in place to make sure the trial goes as well as possible.  -Please contact us with any questions or concerns at any time during the trial.   -If you start running a fever over 100 degrees, have severe back pain, or new pain running down the legs, or drainage coming from the lead site, contact us immediately and/or go to the emergency room.  -Please do not restart any sort of medication that can thin your blood such as Aspirin, ibuprofen, motrin, aleve, plavix, coumadin, etc. If you aren't sure, call and ask.  -We will have you return on 03/21/22  to have the lead removed. If this is successful, at that point we can go over the details about the permanent implant.

## 2022-02-11 NOTE — Progress Notes (Signed)
PROVIDER NOTE: Interpretation of information contained herein should be left to medically-trained personnel. Specific patient instructions are provided elsewhere under "Patient Instructions" section of medical record. This document was created in part using STT-dictation technology, any transcriptional errors that may result from this process are unintentional.  Patient: Sue Cook Type: Established DOB: 1980-11-13 MRN: [id number] PCP: Kandyce Rud, MD  Service: Procedure DOS: 02/11/2022 Setting: Ambulatory Location: Ambulatory outpatient facility Delivery: Face-to-face Provider: Edward Jolly, MD Specialty: Interventional Pain Management Specialty designation: 09 Location: Outpatient facility Ref. Prov.: Edward Jolly, MD    Primary Reason for Admission: Surgical management of chronic pain condition.   Procedure:              Type: [employer] Trial Spinal Cord Neurostimulator Implant (Percutaneous, interlaminar, posterior epidural placement) Laterality: Right (-RT)  Level: Lumbar  Imaging: Fluoroscopic guidance Anesthesia: Local anesthesia (1-2% Lidocaine) Sedation: Moderate Sedation Moderate conscious sedation.         DOS: 02/11/2022  Performed by: Edward Jolly, MD  Purpose: Diagnostic. To determine if a permanent implant may be effective in controlling some or all of Sue Cook's chronic pain symptoms.  Indications: CRPS type I, RLE severe enough to impact quality of life or function. Rationale (medical necessity): procedure needed and proper for the diagnosis and/or treatment of Sue Cook's medical symptoms and needs. 1. Chronic pain syndrome   2. Complex regional pain syndrome type 1 of right lower extremity   3. Intractable neuropathic pain of right foot    NAS-11 Pain score:   Pre-procedure: 1 /10   Post-procedure: 1 /10     Target: Posterior epidural space over the dorsal columns of the spinal cord. Location: Posterior intraspinal canal Region:  Thoracolumbar  Approach: Translaminar percutaneous  Type of procedure: Surgical   Position / Prep / Materials:  Position: Prone  Prep solution: DuraPrep (Iodine Povacrylex [0.7% available iodine] and Isopropyl Alcohol, 74% w/w) Prep Area: Entire  Posterior  Thoracolumbar  Materials:  Tray: Implant tray Needle(s):  Type: Epidural  Gauge (G):  14   Length: Long (15cm)  Qty: 1  Pre-op H&P Assessment:  Sue Cook is a 42 y.o. (year old), female patient, seen today for interventional treatment. She  has a past surgical history that includes Abdominal hysterectomy; Wisdom tooth extraction; Endometrial biopsy; Cholecystectomy (N/A, 06/11/2016); Foot surgery (Right); and Carpal tunnel release (Bilateral).  Initial Vital Signs:  Pulse/EKG Rate: 75ECG Heart Rate: 67 Temp: 98.3 F (36.8 C) Resp: 18 BP: 132/82 SpO2: 98 %  BMI: Estimated body mass index is 41.6 kg/m as calculated from the following:   Height as of this encounter: 5\' 5"  (1.651 m).   Weight as of this encounter: 250 lb (113.4 kg).  Risk Assessment: Allergies: Reviewed. She has No Known Allergies.  Allergy Precautions: None required Coagulopathies: Reviewed. None identified.  Blood-thinner therapy: None at this time Active Infection(s): Reviewed. None identified. Sue Cook is afebrile  Site Confirmation: Sue Cook was asked to confirm the procedure and laterality before marking the site, which she did. Procedure checklist: Completed Consent: Before the procedure and under the influence of no sedative(s), amnesic(s), or anxiolytics, the patient was informed of the treatment options, risks and possible complications. To fulfill our ethical and legal obligations, as recommended by the American Medical Association's Code of Ethics, I have informed the patient of my clinical impression; the nature and purpose of the treatment or procedure; the risks, benefits, and possible complications of the intervention; the alternatives,  including doing nothing; the risk(s) and  benefit(s) of the alternative treatment(s) or procedure(s); and the risk(s) and benefit(s) of doing nothing.  Sue Cook was provided with information about the general risks and possible complications associated with most interventional procedures. These include, but are not limited to: failure to achieve desired goals, infection, bleeding, organ or nerve damage, allergic reactions, paralysis, and/or death.  In addition, she was informed of those risks and possible complications associated to this particular procedure, which include, but are not limited to: damage to the implant; failure to decrease pain; local, systemic, or serious CNS infections, intraspinal abscess with possible cord compression and paralysis, or life-threatening such as meningitis; intrathecal and/or epidural bleeding with formation of hematoma with possible spinal cord compression and permanent paralysis; organ damage; nerve injury or damage with subsequent sensory, motor, and/or autonomic system dysfunction, resulting in transient or permanent pain, numbness, and/or weakness of one or several areas of the body; allergic reactions, either minor or major life-threatening, such as anaphylactic or anaphylactoid reactions.  Furthermore, Sue Cook was informed of those risks and complications associated with the medications. These include, but are not limited to: allergic reactions (i.e.: anaphylactic or anaphylactoid reactions); arrhythmia;  Hypotension/hypertension; cardiovascular collapse; respiratory depression and/or shortness of breath; swelling or edema; medication-induced neural toxicity; particulate matter embolism and blood vessel occlusion with resultant organ, and/or nervous system infarction and permanent paralysis.  Finally, she was informed that Medicine is not an exact science; therefore, there is also the possibility of unforeseen or unpredictable risks and/or possible  complications that may result in a catastrophic outcome. The patient indicated having understood very clearly. We have given the patient no guarantees and we have made no promises. Enough time was given to the patient to ask questions, all of which were answered to the patient's satisfaction. Ms. Hunger has indicated that she wanted to continue with the procedure. Attestation: I, the ordering provider, attest that I have discussed with the patient the benefits, risks, side-effects, alternatives, likelihood of achieving goals, and potential problems during recovery for the procedure that I have provided informed consent. Date  Time: 02/11/2022  8:07 AM  Pre-Procedure Preparation:  Monitoring: As per clinic protocol. Respiration, ETCO2, SpO2, BP, heart rate and rhythm monitor placed and checked for adequate function Safety Precautions: Patient was assessed for positional comfort and pressure points before starting the procedure. Time-out: I initiated and conducted the "Time-out" before starting the procedure, as per protocol. The patient was asked to participate by confirming the accuracy of the "Time Out" information. Verification of the correct person, site, and procedure were performed and confirmed by me, the nursing staff, and the patient. "Time-out" conducted as per Joint Commission's Universal Protocol (UP.01.01.01). Time: [phone]  Description/Narrative of Procedure:          Rationale (medical necessity): procedure needed and proper for the diagnosis and/or treatment of the patient's medical symptoms and needs. Procedural Technique Safety Precautions: Aspiration looking for blood return was conducted prior to all injections. At no point did we inject any substances, as a needle was being advanced. No attempts were made at seeking any paresthesias. Safe injection practices and needle disposal techniques used. Medications properly checked for expiration dates. SDV (single dose vial) medications  used. Description of the Procedure: Protocol guidelines were followed. The patient was assisted into a comfortable position. The target area was identified and the area prepped in the usual manner. Skin & deeper tissues infiltrated with local anesthetic. Appropriate amount of time allowed to pass for local anesthetics to take effect. The procedure  needles were then advanced to the target area. Proper needle placement secured. Negative aspiration confirmed. Solution injected in intermittent fashion, asking for systemic symptoms every 0.5cc of injectate. The needles were then removed and the area cleansed, making sure to leave some of the prepping solution back to take advantage of its long term bactericidal properties.  Technical description of procedure: Availability of a responsible, adult driver, and NPO status confirmed. Informed consent was obtained after having discussed risks and possible complications. An IV was started. The patient was then taken to the fluoroscopy suite, where the patient was placed in position for the procedure, over the fluoroscopy table. The patient was then monitored in the usual manner. Fluoroscopy was manipulated to obtain the best possible view of the target. Parallex error was corrected before commencing the procedure. Once a clear view of the target had been obtained, the skin and deeper tissues over the procedure site were infiltrated using lidocaine, loaded in a 10 cc luer-loc syringe with a 0.5 inch, 25-G needle. The introducer needle(s) was/were then inserted through the skin and deeper tissues. A paramidline approach was used to enter the posterior epidural space at a 30 angle, using "Loss-of-resistance Technique" with 3 ml of PF-NaCl (0.9% NSS). Correct needle placement was confirmed in the antero-posterior and lateral fluoroscopic views. The lead was gently introduced and manipulated under real-time fluoroscopy, constantly assessing for pain, discomfort, or  paresthesias, until the tip rested at the desired level. (RIGHT SIDE) Electrode placement was tested until appropriate coverage was attained. Once the patient confirmed that the stimulation was over the desired area, the lead(s) was/were secured in place and the introducer needles removed. This was done under real-time fluoroscopy while observing the electrode tip to avoid unintended migration. The area was covered with a non-occlusive dressing and the patient transported to recovery for further programming.  Vitals:   02/11/22 0933 02/11/22 0944 02/11/22 0949 02/11/22 1000  BP: 124/87 120/83 115/71 118/76  Pulse:      Resp: 16 15 14 15   Temp:      TempSrc:      SpO2: 100% 100% 99% 100%  Weight:      Height:        Start Time: 0847 hrs. End Time: 0932 hrs.  Neurostimulator Details:   Lead(s):  Brand: [employer]         Epidural Access Level:  L1-2  Lead implant:  Right  No. of Electrodes/Lead:  16  Laterality:  Right  Top electrode location:  T11-12  Bottom electrode location:  T12-L1  Model No.: [id number]  Length: 50cm  Lot No.: [id number]  MRI compatibility:  Yes   Imaging Guidance (Spinal):          Type of Imaging Technique: Fluoroscopy Guidance (Spinal) Indication(s): Assistance in needle guidance and placement for procedures requiring needle placement in or near specific anatomical locations not easily accessible without such assistance. Exposure Time: Please see nurses notes. Contrast: None used. Fluoroscopic Guidance: I was personally present during the use of fluoroscopy. "Tunnel Vision Technique" used to obtain the best possible view of the target area. Parallax error corrected before commencing the procedure. "Direction-depth-direction" technique used to introduce the needle under continuous pulsed fluoroscopy. Once target was reached, antero-posterior, oblique, and lateral fluoroscopic projection used confirm needle placement in all planes. Images permanently  stored in EMR. Interpretation: No contrast injected. I personally interpreted the imaging intraoperatively. Adequate needle placement confirmed in multiple planes. Permanent images saved into the patient's record.  Antibiotic Prophylaxis:  2g ANCEF pre-procedure  Anti-infectives (From admission, onward)    Start     Dose/Rate Route Frequency Ordered Stop   02/11/22 0830  ceFAZolin (ANCEF) IVPB 2g/100 mL premix        2 g 200 mL/hr over 30 Minutes Intravenous  Once 02/11/22 0824 02/11/22 0900   02/11/22 0000  cephALEXin (KEFLEX) 500 MG capsule        500 mg Oral 4 times daily 02/11/22 0823 02/18/22 2359      Indication(s): Implant Prophylaxis.  Post-operative Assessment:  Post-procedure Vital Signs:  Pulse/HCG Rate: 7563 Temp: 98.3 F (36.8 C) Resp: 15 BP: 118/76 SpO2: 100 %  Complications: No immediate post-treatment complications observed by team, or reported by patient.  Note: The patient tolerated the entire procedure well. A repeat set of vitals were taken after the procedure and the patient was kept under observation following institutional policy, for this type of procedure. Post-procedural neurological assessment was performed, showing return to baseline, prior to discharge. The patient was provided with post-procedure discharge instructions, including a section on how to identify potential problems. Should any problems arise concerning this procedure, the patient was given instructions to immediately contact us, at any time, without hesitation. In any case, we plan to contact the patient by telephone for a follow-up status report regarding this interventional procedure.  Comments:  No additional relevant information.  Plan of Care  Orders:  Orders Placed This Encounter  Procedures   DG PAIN CLINIC C-ARM 1-60 MIN NO REPORT    Intraoperative interpretation by procedural physician at [facility].    Standing Status:   Standing    Number of Occurrences:    1    Order Specific Question:   Reason for exam:    Answer:   Assistance in needle guidance and placement for procedures requiring needle placement in or near specific anatomical locations not easily accessible without such assistance.    Medications administered: We administered lidocaine, lactated ringers, midazolam, fentaNYL, ropivacaine (PF) 2 mg/mL (0.2%), and ceFAZolin.  See the medical record for exact dosing, route, and time of administration.  Follow-up plan:   Return in about 1 week (around 02/18/2022) for SCS lead pull .     Right L3 LSB 10/24/21, 11/28/21 , [place] SCS trial 02/11/22   Recent Visits Date Type Provider Dept  01/09/22 Office Visit Edward Jolly, MD [facility]  11/28/21 Procedure visit Edward Jolly, MD [facility]  Showing recent visits within past 90 days and meeting all other requirements Today's Visits Date Type Provider Dept  02/11/22 Procedure visit Edward Jolly, MD [facility]  Showing today's visits and meeting all other requirements Future Appointments Date Type Provider Dept  02/18/22 Appointment Edward Jolly, MD [facility]  Showing future appointments within next 90 days and meeting all other requirements  Disposition: Discharge home  Discharge (Date  Time): 02/11/2022;   hrs.   Primary Care Physician: Kandyce Rud, MD Location: [facility] Note by: Edward Jolly, MD Date: 02/11/2022; Time: 10:08 AM

## 2022-02-11 NOTE — Progress Notes (Signed)
Safety precautions to be maintained throughout the outpatient stay will include: orient to surroundings, keep bed in low position, maintain call bell within reach at all times, provide assistance with transfer out of bed and ambulation.  

## 2022-02-12 ENCOUNTER — Telehealth: Payer: Self-pay | Admitting: *Deleted

## 2022-02-12 NOTE — Telephone Encounter (Signed)
Patient returned call and states doing well. SCS trial still intact and working. Should hear from rep tocay. Taking antibiotic as prescribed. Denies any problems.

## 2022-02-12 NOTE — Telephone Encounter (Signed)
Attempted to call for post procedure follow-up. Message left. 

## 2022-02-14 ENCOUNTER — Telehealth: Payer: Self-pay | Admitting: Podiatry

## 2022-02-14 NOTE — Telephone Encounter (Signed)
Good Morning, I am still holding for patients 02/08/2022, office visit notes to send to disability company with extension of leave for additional 12 weeks. Leave extended until 05/09/2022, next appt 05/07/2022

## 2022-02-18 ENCOUNTER — Ambulatory Visit
Payer: Managed Care, Other (non HMO) | Attending: Student in an Organized Health Care Education/Training Program | Admitting: Student in an Organized Health Care Education/Training Program

## 2022-02-18 ENCOUNTER — Ambulatory Visit
Admission: RE | Admit: 2022-02-18 | Discharge: 2022-02-18 | Disposition: A | Discharge status: Home / Self Care | Payer: Managed Care, Other (non HMO) | Source: Ambulatory Visit | Attending: Student in an Organized Health Care Education/Training Program | Admitting: Student in an Organized Health Care Education/Training Program

## 2022-02-18 ENCOUNTER — Encounter: Payer: Self-pay | Admitting: Student in an Organized Health Care Education/Training Program

## 2022-02-18 VITALS — BP 132/79 | HR 83 | Temp 98.2°F | Ht 65.0 in | Wt 248.0 lb

## 2022-02-18 DIAGNOSIS — G8929 Other chronic pain: Secondary | ICD-10-CM | POA: Insufficient documentation

## 2022-02-18 DIAGNOSIS — G894 Chronic pain syndrome: Secondary | ICD-10-CM | POA: Diagnosis present

## 2022-02-18 DIAGNOSIS — M25561 Pain in right knee: Secondary | ICD-10-CM | POA: Diagnosis present

## 2022-02-18 DIAGNOSIS — M1711 Unilateral primary osteoarthritis, right knee: Secondary | ICD-10-CM | POA: Insufficient documentation

## 2022-02-18 DIAGNOSIS — G90521 Complex regional pain syndrome I of right lower limb: Secondary | ICD-10-CM | POA: Insufficient documentation

## 2022-02-18 NOTE — Patient Instructions (Signed)

## 2022-02-18 NOTE — Progress Notes (Signed)
Safety precautions to be maintained throughout the outpatient stay will include: orient to surroundings, keep bed in low position, maintain call bell within reach at all times, provide assistance with transfer out of bed and ambulation.  

## 2022-02-18 NOTE — Progress Notes (Signed)
PROVIDER NOTE: Information contained herein reflects review and annotations entered in association with encounter. Interpretation of such information and data should be left to medically-trained personnel. Information provided to patient can be located elsewhere in the medical record under "Patient Instructions". Document created using STT-dictation technology, any transcriptional errors that may result from process are unintentional.    Patient: Sue Cook  Service Category: E/M  Provider: Edward Jolly, MD  DOB: [birth year]  DOS: 02/18/2022  Referring Provider: Kandyce Rud, MD  MRN: [id number]  Specialty: Interventional Pain Management  PCP: Kandyce Rud, MD  Type: Established Patient  Setting: Ambulatory outpatient    Location: Office  Delivery: Face-to-face     HPI  Sue Cook, a 42 y.o. year old female, is here today because of her Complex regional pain syndrome type 1 of right lower extremity [G90.521]. Sue Cook's primary complain today is Knee Pain (Right ) Last encounter: My last encounter with her was on 02/11/2022. Pertinent problems: Sue Cook has Complex regional pain syndrome type 1 of right lower extremity; Chronic pain syndrome; Chronic pain of right knee; and Primary osteoarthritis of right knee on their pertinent problem list. Pain Assessment: Severity of Chronic pain is reported as a 0-No pain/10. Location: Knee Right/to calf. Onset: More than a month ago. Quality: Shooting, Tightness. Timing: Constant. Modifying factor(s): procedures. Vitals:  height is 5\' 5"  (1.651 m) and weight is 248 lb (112.5 kg). Her temporal temperature is 98.2 F (36.8 C). Her blood pressure is 132/79 and her pulse is 83. Her oxygen saturation is 99%.  BMI: Estimated body mass index is 41.27 kg/m as calculated from the following:   Height as of this encounter: 5\' 5"  (1.651 m).   Weight as of this encounter: 248 lb (112.5 kg).  Reason for encounter:  [employer] SCS lead pull and  to discuss persistent right knee pain  .   Patient presents today for spinal cord stimulator lead pull for right ankle and foot pain related to CRPS.  She states that she had a successful trial and endorses greater than 65% pain relief for the 7 days of her SCS trial and improvement in her ability to ambulate and perform ADLs She continues to endorse persistent right knee pain that is worse with weightbearing.  Previous right knee x-ray showed mild degenerative joint disease.  Recommend right knee MRI to evaluate for any ligamentous or meniscus injury.  ROS  Constitutional: Denies any fever or chills Gastrointestinal: No reported hemesis, hematochezia, vomiting, or acute GI distress Musculoskeletal:  Right knee pain Neurological:  Improvement in right foot and ankle pain during SCS trial  Medication Review  SUMAtriptan, Semaglutide-Weight Management, buPROPion, cephALEXin, cholecalciferol, cyanocobalamin, and multivitamin with minerals  History Review  Allergy: Sue Cook has No Known Allergies. Drug: Sue Cook  reports no history of drug use. Alcohol:  reports current alcohol use. Tobacco:  reports that she has never smoked. She has never been exposed to tobacco smoke. She has never used smokeless tobacco. Social: Sue Cook  reports that she has never smoked. She has never been exposed to tobacco smoke. She has never used smokeless tobacco. She reports current alcohol use. She reports that she does not use drugs. Medical:  has a past medical history of Ankle pain (05/2016), Complication of anesthesia, GERD (gastroesophageal reflux disease), Headache, and PONV (postoperative nausea and vomiting). Surgical: Sue Cook  has a past surgical history that includes Abdominal hysterectomy; Wisdom tooth extraction; Endometrial biopsy; Cholecystectomy (N/A, 06/11/2016); Foot surgery (Right);  and Carpal tunnel release (Bilateral). Family: family history includes Breast cancer in her maternal aunt  and maternal grandmother; Cancer in her mother; Diabetes in her sister; Heart attack in her father, maternal grandfather, maternal grandmother, and paternal grandfather.  Laboratory Chemistry Profile   Renal Lab Results  Component Value Date   BUN 18 03/27/2021   CREATININE 0.82 03/27/2021   BCR 22 03/27/2021   GFRAA 113 02/11/2018   GFRNONAA 98 02/11/2018    Hepatic Lab Results  Component Value Date   AST 12 02/11/2018   ALT 20 02/11/2018   ALBUMIN 4.7 02/11/2018   ALKPHOS 66 02/11/2018    Electrolytes Lab Results  Component Value Date   NA 141 03/27/2021   K 4.4 03/27/2021   CL 101 03/27/2021   CALCIUM 9.4 03/27/2021   MG 2.1 03/27/2021    Bone Lab Results  Component Value Date   VD25OH 34.5 03/27/2021    Inflammation (CRP: Acute Phase) (ESR: Chronic Phase) No results found for: "CRP", "ESRSEDRATE", "LATICACIDVEN"       Note: Above Lab results reviewed.  Recent Imaging Review  DG PAIN CLINIC C-ARM 1-60 MIN NO REPORT Fluoro was used, but no Radiologist interpretation will be provided.  Please refer to "NOTES" tab for provider progress note.   CLINICAL DATA:  Right knee pain/arthralgia. Chronic pain of right knee. Primary osteoarthritis.   EXAM: RIGHT KNEE - COMPLETE 4+ VIEW   COMPARISON:  None Available.   FINDINGS: Normal alignment. The joint spaces are preserved. There is trace early peripheral spurring in the medial tibiofemoral and lateral patellofemoral compartments. No significant knee joint effusion. No acute or evidence of prior fracture. No erosion, bony destruction or focal lesion. Unremarkable soft tissues.   IMPRESSION: Trace early degenerative peripheral spurring in the medial tibiofemoral and lateral patellofemoral compartments.    Note: Reviewed        Physical Exam  General appearance: Well nourished, well developed, and well hydrated. In no apparent acute distress Mental status: Alert, oriented x 3 (person, place, & time)        Respiratory: No evidence of acute respiratory distress Eyes: PERLA Vitals: BP 132/79 (BP Location: Left Arm, Patient Position: Sitting)   Pulse 83   Temp 98.2 F (36.8 C) (Temporal)   Ht 5\' 5"  (1.651 m)   Wt 248 lb (112.5 kg)   SpO2 99%   BMI 41.27 kg/m  BMI: Estimated body mass index is 41.27 kg/m as calculated from the following:   Height as of this encounter: 5\' 5"  (1.651 m).   Weight as of this encounter: 248 lb (112.5 kg). Ideal: Ideal body weight: 57 kg (125 lb 10.6 oz) Adjusted ideal body weight: 79.2 kg (174 lb 9.6 oz)  SCS trial lead removed under live fluoroscopy with tip intact  Assessment   Diagnosis Status  1. Complex regional pain syndrome type 1 of right lower extremity   2. Chronic pain of right knee   3. Primary osteoarthritis of right knee   4. Chronic pain syndrome    Responding Responding Persistent    Plan of Care  1. Complex regional pain syndrome type 1 of right lower extremity - Ambulatory referral to Pain Clinic  2. Chronic pain of right knee - MR KNEE RIGHT WO CONTRAST; Future  3. Primary osteoarthritis of right knee - MR KNEE RIGHT WO CONTRAST; Future  4. Chronic pain syndrome - DG PAIN CLINIC C-ARM 1-60 MIN NO REPORT; Standing - MR KNEE RIGHT WO CONTRAST; Future - DG PAIN  CLINIC C-ARM 1-60 MIN NO REPORT - Ambulatory referral to Pain Clinic  Referral to Dr. Barrie Dunker for percutaneous spinal cord stimulator implant for right CRPS Right knee MRI for further diagnostic workup of persistent and severe right knee pain  Orders:  Orders Placed This Encounter  Procedures   DG PAIN CLINIC C-ARM 1-60 MIN NO REPORT    Intraoperative interpretation by procedural physician at [facility].    Standing Status:   Standing    Number of Occurrences:   1    Order Specific Question:   Reason for exam:    Answer:   Assistance in needle guidance and placement for procedures requiring needle placement in or near specific anatomical  locations not easily accessible without such assistance.   MR KNEE RIGHT WO CONTRAST    [employer] EPIC ORDER NO COVID WT:248 HT:5'5 NO NEEDS/NO CLAUS/ NO METAL REMOVED/ NO IMPLANTS/ NO BULLETS OR BB'S/ NO GLUCOSE MONITOR,  STIMULATOR OR INJECTORS DEFIBRULATOR NO PORT NO BRAIN CLIP / NO PREV SX/ NO BRAIN HEART EYE OR EAR SX DP AND PT HUSBAND  02-18-2022 ** PT AWARE OF 75$ NO SHOW FEE**    Standing Status:   Future    Standing Expiration Date:   03/21/2022    Scheduling Instructions:     Please make sure that the patient understands that this needs to be done as soon as possible. Never have the patient do the imaging "just before the next appointment". Inform patient that having the imaging done within the [facility] will expedite the availability of the results and will provide      imaging availability to the requesting physician. In addition inform the patient that the imaging order has an expiration date and will not be renewed if not done within the active period.    Order Specific Question:   What is the patient's sedation requirement?    Answer:   No Sedation    Order Specific Question:   Does the patient have a pacemaker or implanted devices?    Answer:   No    Order Specific Question:   Preferred imaging location?    Answer:   [facility] [place]    Order Specific Question:   Call Results- Best Contact Number?    Answer:   [phone] [facility])    Order Specific Question:   Radiology Contrast Protocol - do NOT remove file path    Answer:   [email or web address]   Ambulatory referral to Pain Clinic    Referral Priority:   Routine    Referral Type:   Consultation    Referral Reason:   Specialty Services Required    Referred to Provider:   Salena Saner, MD    Requested Specialty:   Pain Medicine    Number of Visits Requested:   1   Follow-up plan:   Return in about 3 weeks (around 03/11/2022) for to review right knee MRI.     Right L3 LSB 10/24/21,  11/28/21 , [employer] SCS trial 02/11/22    Recent Visits Date Type Provider Dept  02/11/22 Procedure visit Edward Jolly, MD [facility]  01/09/22 Office Visit Edward Jolly, MD [facility]  11/28/21 Procedure visit Edward Jolly, MD [facility]  Showing recent visits within past 90 days and meeting all other requirements Today's Visits Date Type Provider Dept  02/18/22 Procedure visit Edward Jolly, MD [facility]  Showing today's visits and meeting all other requirements Future Appointments No visits were  found meeting these conditions. Showing future appointments within next 90 days and meeting all other requirements  I discussed the assessment and treatment plan with the patient. The patient was provided an opportunity to ask questions and all were answered. The patient agreed with the plan and demonstrated an understanding of the instructions.  Patient advised to call back or seek an in-person evaluation if the symptoms or condition worsens.  Duration of encounter: 60minutes.  Total time on encounter, as per AMA guidelines included both the face-to-face and non-face-to-face time personally spent by the physician and/or other qualified health care professional(s) on the day of the encounter (includes time in activities that require the physician or other qualified health care professional and does not include time in activities normally performed by clinical staff). Physician's time may include the following activities when performed: Preparing to see the patient (e.g., pre-charting review of records, searching for previously ordered imaging, lab work, and nerve conduction tests) Review of prior analgesic pharmacotherapies. Reviewing PMP Interpreting ordered tests (e.g., lab work, imaging, nerve conduction tests) Performing post-procedure evaluations, including interpretation of diagnostic procedures Obtaining and/or reviewing separately obtained  history Performing a medically appropriate examination and/or evaluation Counseling and educating the patient/family/caregiver Ordering medications, tests, or procedures Referring and communicating with other health care professionals (when not separately reported) Documenting clinical information in the electronic or other health record Independently interpreting results (not separately reported) and communicating results to the patient/ family/caregiver Care coordination (not separately reported)  Note by: Gillis Santa, MD Date: 02/18/2022; Time: 9:51 AM

## 2022-02-21 ENCOUNTER — Telehealth: Payer: Self-pay | Admitting: Student in an Organized Health Care Education/Training Program

## 2022-02-21 NOTE — Telephone Encounter (Signed)
PT stated that [facility] needs her notes send over to extend her disability. PT stated that the forms were send over on 02-12-22. Please give patient a call. Thanks

## 2022-02-21 NOTE — Telephone Encounter (Signed)
Forms were put on Dr Elmon Else desk to fill out if he chooses to.

## 2022-02-25 ENCOUNTER — Ambulatory Visit: Payer: Managed Care, Other (non HMO) | Admitting: Student in an Organized Health Care Education/Training Program

## 2022-02-26 ENCOUNTER — Encounter: Payer: Self-pay | Admitting: Student in an Organized Health Care Education/Training Program

## 2022-02-27 ENCOUNTER — Other Ambulatory Visit: Payer: Managed Care, Other (non HMO)

## 2022-03-04 ENCOUNTER — Telehealth: Payer: Self-pay

## 2022-03-04 DIAGNOSIS — M1711 Unilateral primary osteoarthritis, right knee: Secondary | ICD-10-CM

## 2022-03-04 DIAGNOSIS — G8929 Other chronic pain: Secondary | ICD-10-CM

## 2022-03-04 NOTE — Telephone Encounter (Addendum)
Her knee has been really hurting, she thinks its the CRPS. She wants to know if she can come in and get the knee block you talked about. The knee MRI was denied by insurance.

## 2022-03-06 ENCOUNTER — Ambulatory Visit
Admission: RE | Admit: 2022-03-06 | Discharge: 2022-03-06 | Disposition: A | Discharge status: Home / Self Care | Payer: Managed Care, Other (non HMO) | Source: Ambulatory Visit | Attending: Student in an Organized Health Care Education/Training Program | Admitting: Student in an Organized Health Care Education/Training Program

## 2022-03-06 ENCOUNTER — Ambulatory Visit
Payer: Managed Care, Other (non HMO) | Attending: Student in an Organized Health Care Education/Training Program | Admitting: Student in an Organized Health Care Education/Training Program

## 2022-03-06 VITALS — BP 147/88 | HR 76 | Temp 98.1°F | Resp 16 | Ht 65.0 in | Wt 249.0 lb

## 2022-03-06 DIAGNOSIS — M25561 Pain in right knee: Secondary | ICD-10-CM | POA: Insufficient documentation

## 2022-03-06 DIAGNOSIS — M1711 Unilateral primary osteoarthritis, right knee: Secondary | ICD-10-CM | POA: Insufficient documentation

## 2022-03-06 DIAGNOSIS — G8929 Other chronic pain: Secondary | ICD-10-CM | POA: Diagnosis present

## 2022-03-06 MED ORDER — ROPIVACAINE HCL 2 MG/ML IJ SOLN
9.0000 mL | Freq: Once | INTRAMUSCULAR | Status: AC
  Administered 2022-03-06: 9 mL via PERINEURAL
  Filled 2022-03-06: qty 20

## 2022-03-06 MED ORDER — DEXAMETHASONE SODIUM PHOSPHATE 10 MG/ML IJ SOLN
10.0000 mg | Freq: Once | INTRAMUSCULAR | Status: AC
  Administered 2022-03-06: 10 mg
  Filled 2022-03-06: qty 1

## 2022-03-06 MED ORDER — LIDOCAINE HCL 2 % IJ SOLN
20.0000 mL | Freq: Once | INTRAMUSCULAR | Status: AC
  Administered 2022-03-06: 100 mg
  Filled 2022-03-06: qty 40

## 2022-03-06 NOTE — Patient Instructions (Signed)
____________________________________________________________________________________________  Genicular Nerve Block  What is a genicular nerve block? A genicular nerve block is the injection of a local anesthetic to block the nerves that transmits pain from the knee.  What is the purpose of a facet nerve block? A genicular nerve block is a diagnostic procedure to determine if the pathologic changes (i.e. arthritis, meniscal tears, etc) and inflammation within the knee joint is the source of your knee pain. It also confirms that the knee pain will respond well to the actual treatment procedure. If a genicular nerve block works, it will give you relief for several hours. After that, the pain is expected to return to normal. This test is always performed twice (usually a week or two apart) because two successful tests are required to move onto treatment. If both diagnostic tests are positive, then we schedule a treatment called radiofrequency (RF) ablation. In this procedure, the same nerves are cauterized, which typically leads to pain relief for 4 -18 months. If this process works well for one knee, it can be performed on the other knee if needed.  How is the procedure performed? You will be placed on the procedure table. The injection site is sterilized with either iodine or chlorhexadine. The site to be injected is numbed with a local anesthetic, and a needle is directed to the target area. X-ray guidance is used to ensure proper placement and positioning of the needle. When the needle is properly positioned near the genicular nerve, local anesthetic is injected to numb that nerve. This will be repeated at multiple sites around the knee to block all genicular nerves.  Will the procedure be painful? The injection can be painful and we therefore provide the option of receiving IV sedation. IV sedation, combined with local anesthetic, can make the injection nearly pain free. It allows you to remain very  still during the procedure, which can also make the injection easier, faster, and more successful. If you decide to have IV sedation, you must have a driver to get you home safely afterwards. In addition, you cannot have anything to eat or drink within 8 hours of your appointment (clear liquids are allowed until 3 hours before the procedure). If you take medications for diabetes, these medications may need to be adjusted the morning of the procedure. Your primary care physician can help you with this adjustment.  What are the discharge instructions? If you received IV sedation do not drive or operate machinery for at least 24 hours after the procedure. You may return to work the next day following your procedure. You may resume your normal diet immediately. Do not engage in any strenuous activity for 24 hours. You should, however, engage in moderate activity that typically causes your ususal pain. If the block works, those activities should not be painful for several hours after the injection. Do not take a bath, swim, or use a hot tub for 24 hours (you may take a shower). Call the office if you have any of the following: severe pain afterwards (different than your usual symptoms), redness/swelling/discharge at the injection site(s), fevers/chills, difficulty with bowel or bladder functions.  What are the risks and side effects? The complication rate for this procedure is very low. Whenever a needle enters the skin, bleeding or infection can occur. Some other serious but extremely rare risks include paralysis and death. You may have an allergic reaction to any of the medications used. If you have a known allergy to any medications, especially local anesthetics, notify  our staff before the procedure takes place. You may experience any of the following side effects up to 4 - 6 hours after the procedure: Leg muscle weakness or numbness may occur due to the local anesthetic affecting the nerves that control  your legs (this is a temporary affect and it is not paralysis). If you have any leg weakness or numbness, walk only with assistance in order to prevent falls and injury. Your leg strength will return slowly and completely. Dizziness may occur due to a decrease in your blood pressure. If this occurs, remain in a seated or lying position. Gradually sit up, and then stand after at least 10 minutes of sitting. Mild headaches may occur. Drink fluids and take pain medications if needed. If the headaches persist or become severe, call the office. Mild discomfort at the injection site can occur. This typically lasts for a few hours but can persist for a couple days. If this occurs, take anti-inflammatories or pain medications, apply ice to the area the day of the procedure. If it persists, apply moist heat in the day(s) following.  The side effects listed above can be normal. They are not dangerous and will resolve on their own. If, however, you experience any of the following, a complication may have occurred and you should either contact your doctor. If he is not readily available, then you should proceed to the closest urgent care center for evaluation: Severe or progressive pain at the injection site(s) Arm or leg weakness that progressively worsens or persists for longer than 8 hours Severe or progressive redness, swelling, or discharge from the injections site(s) Fevers, chills, nausea, or vomiting Bowel or bladder dysfunction (i.e. inability to urinate or pass stool or difficulty controlling either)  How long does it take for the procedure to work? You should feel relief from your usual pain within the first hour. Again, this is only expected to last for several hours, at the most. Remember, you may be sore in the middle part of your back from the needles, and you must distinguish this from your usual pain. ____________________________________________________________________________________________    ____________________________________________________________________________________________  Post-Procedure Discharge Instructions  Instructions: Apply ice:  Purpose: This will minimize any swelling and discomfort after procedure.  When: Day of procedure, as soon as you get home. How: Fill a plastic sandwich bag with crushed ice. Cover it with a small towel and apply to injection site. How long: (15 min on, 15 min off) Apply for 15 minutes then remove x 15 minutes.  Repeat sequence on day of procedure, until you go to bed. Apply heat:  Purpose: To treat any soreness and discomfort from the procedure. When: Starting the next day after the procedure. How: Apply heat to procedure site starting the day following the procedure. How long: May continue to repeat daily, until discomfort goes away. Food intake: Start with clear liquids (like water) and advance to regular food, as tolerated.  Physical activities: Keep activities to a minimum for the first 8 hours after the procedure. After that, then as tolerated. Driving: If you have received any sedation, be responsible and do not drive. You are not allowed to drive for 24 hours after having sedation. Blood thinner: (Applies only to those taking blood thinners) You may restart your blood thinner 6 hours after your procedure. Insulin: (Applies only to Diabetic patients taking insulin) As soon as you can eat, you may resume your normal dosing schedule. Infection prevention: Keep procedure site clean and dry. Shower daily and clean  area with soap and water. Post-procedure Pain Diary: Extremely important that this be done correctly and accurately. Recorded information will be used to determine the next step in treatment. For the purpose of accuracy, follow these rules: Evaluate only the area treated. Do not report or include pain from an untreated area. For the purpose of this evaluation, ignore all other areas of pain, except for the treated  area. After your procedure, avoid taking a long nap and attempting to complete the pain diary after you wake up. Instead, set your alarm clock to go off every hour, on the hour, for the initial 8 hours after the procedure. Document the duration of the numbing medicine, and the relief you are getting from it. Do not go to sleep and attempt to complete it later. It will not be accurate. If you received sedation, it is likely that you were given a medication that may cause amnesia. Because of this, completing the diary at a later time may cause the information to be inaccurate. This information is needed to plan your care. Follow-up appointment: Keep your post-procedure follow-up evaluation appointment after the procedure (usually 2 weeks for most procedures, 6 weeks for radiofrequencies). DO NOT FORGET to bring you pain diary with you.   Expect: (What should I expect to see with my procedure?) From numbing medicine (AKA: Local Anesthetics): Numbness or decrease in pain. You may also experience some weakness, which if present, could last for the duration of the local anesthetic. Onset: Full effect within 15 minutes of injected. Duration: It will depend on the type of local anesthetic used. On the average, 1 to 8 hours.  From steroids (Applies only if steroids were used): Decrease in swelling or inflammation. Once inflammation is improved, relief of the pain will follow. Onset of benefits: Depends on the amount of swelling present. The more swelling, the longer it will take for the benefits to be seen. In some cases, up to 10 days. Duration: Steroids will stay in the system x 2 weeks. Duration of benefits will depend on multiple posibilities including persistent irritating factors. Side-effects: If present, they may typically last 2 weeks (the duration of the steroids). Frequent: Cramps (if they occur, drink Gatorade and take over-the-counter Magnesium 450-500 mg once to twice a day); water retention with  temporary weight gain; increases in blood sugar; decreased immune system response; increased appetite. Occasional: Facial flushing (red, warm cheeks); mood swings; menstrual changes. Uncommon: Long-term decrease or suppression of natural hormones; bone thinning. (These are more common with higher doses or more frequent use. This is why we prefer that our patients avoid having any injection therapies in other practices.)  Very Rare: Severe mood changes; psychosis; aseptic necrosis. From procedure: Some discomfort is to be expected once the numbing medicine wears off. This should be minimal if ice and heat are applied as instructed.  Call if: (When should I call?) You experience numbness and weakness that gets worse with time, as opposed to wearing off. New onset bowel or bladder incontinence. (Applies only to procedures done in the spine)  Emergency Numbers: Durning business hours (Monday - Thursday, 8:00 AM - 4:00 PM) (Friday, 9:00 AM - 12:00 Noon): [phone] After hours: [phone] NOTE: If you are having a problem and are unable connect with, or to talk to a provider, then go to your nearest urgent care or emergency department. If the problem is serious and urgent, please call 911. ____________________________________________________________________________________________

## 2022-03-06 NOTE — Progress Notes (Signed)
Safety precautions to be maintained throughout the outpatient stay will include: orient to surroundings, keep bed in low position, maintain call bell within reach at all times, provide assistance with transfer out of bed and ambulation.  

## 2022-03-06 NOTE — Progress Notes (Signed)
PROVIDER NOTE: Interpretation of information contained herein should be left to medically-trained personnel. Specific patient instructions are provided elsewhere under "Patient Instructions" section of medical record. This document was created in part using STT-dictation technology, any transcriptional errors that may result from this process are unintentional.  Patient: Sue Cook Type: Established DOB: 24-Jan-1980 MRN: [id number] PCP: Derinda Late, MD  Service: Procedure DOS: 03/06/2022 Setting: Ambulatory Location: Ambulatory outpatient facility Delivery: Face-to-face Provider: Gillis Santa, MD Specialty: Interventional Pain Management Specialty designation: 09 Location: Outpatient facility Ref. Prov.: Derinda Late, MD       Interventional Therapy   Primary Reason for Visit: Interventional Pain Management Treatment. CC: Knee Pain   Procedure:               Type: Genicular Nerves Block (Superolateral, Superomedial, and Inferomedial Genicular Nerves)  #1  Laterality: Right (-RT)  Level: Superior and inferior to the knee joint.  Imaging: Fluoroscopic guidance Anesthesia: Local anesthesia (1-2% Lidocaine) DOS: 03/06/2022  Performed by: Gillis Santa, MD  Purpose: Diagnostic/Therapeutic Indications: Chronic knee pain severe enough to impact quality of life or function. Rationale (medical necessity): procedure needed and proper for the diagnosis and/or treatment of Sue Cook's medical symptoms and needs. 1. Chronic pain of right knee   2. Primary osteoarthritis of right knee    NAS-11 Pain score:   Pre-procedure: 5 /10   Post-procedure: 3 /10     Target: For Genicular Nerve block(s), the targets are: the superolateral genicular nerve, located in the lateral distal portion of the femoral shaft as it curves to form the lateral epicondyle, in the region of the distal femoral metaphysis; the superomedial genicular nerve, located in the medial distal portion of the femoral  shaft as it curves to form the medial epicondyle; and the inferomedial genicular nerve, located in the medial, proximal portion of the tibial shaft, as it curves to form the medial epicondyle, in the region of the proximal tibial metaphysis.  Location: Superolateral, Superomedial, and Inferomedial aspects of knee joint.  Region: Lateral, Anterior, and Medial aspects of the knee joint, above and below the knee joint proper. Approach: Percutaneous  Type of procedure: Percutaneous perineural nerve block. The genicular nerve block is a motor-sparing technique that anesthetizes the sensory terminal branches innervating the knee joint, resulting in anesthesia of the anterior compartment of the knee. The distribution of anesthesia of each nerve is mostly in the corresponding quadrant.  Neuroanatomy: The superolateral genicular nerve (SLGN) courses around the femur shaft to pass between the vastus lateralis and the lateral epicondyle. It accompanies the superior lateral genicular artery. The superomedial genicular nerve (SMGN) courses around the femur shaft, following the superior medial genicular artery, to pass between the adductor magnus tendon and the medial epicondyle below the vastus medialis. The inferolateral genicular nerve (ILGN) courses around the tibial lateral epicondyle deep to the lateral collateral ligament, following the inferior lateral genicular artery, superior of the fibula head. The inferomedial genicular nerve (IMGN) courses horizontally below the medial collateral ligament between the tibial medial epicondyle and the insertion of the collateral ligament. It accompanies the inferior medial genicular artery. The recurrent peroneal nerve originates in the inferior popliteal region from the common peroneal nerve and courses horizontally around the fibula to pass just inferior of the fibula head and travel superior to the anterolateral tibial epicondyle. It accompanies the recurrent tibial  artery.  Position / Prep / Materials:  Position: Supine, Modified Fowler's position with pillows under the targeted knee(s). The patient is placed in a  supine position with the knee slightly flexed by placing a pillow in the popliteal fossa. Prep solution: DuraPrep (Iodine Povacrylex [0.7% available iodine] and Isopropyl Alcohol, 74% w/w) Prep Area: Entire knee area, from mid-thigh to mid-shin, lateral, anterior, and medial aspects. Materials:  Tray: Block Needle(s):  Type: Spinal  Gauge (G): 22  Length: 3.5-in  Qty: 3  Pre-op H&P Assessment:  Sue Cook is a 42 y.o. (year old), female patient, seen today for interventional treatment. She  has a past surgical history that includes Abdominal hysterectomy; Wisdom tooth extraction; Endometrial biopsy; Cholecystectomy (N/A, 06/11/2016); Foot surgery (Right); and Carpal tunnel release (Bilateral). Sue Cook has a current medication list which includes the following prescription(s): sumatriptan, bupropion, cholecalciferol, cyanocobalamin, multivitamin with minerals, and semaglutide-weight management. Her primarily concern today is the Knee Pain  Initial Vital Signs:  Pulse/HCG Rate: 76ECG Heart Rate: 71 Temp: 98.1 F (36.7 C) Resp: 16 BP: 130/85 SpO2: 100 %  BMI: Estimated body mass index is 41.44 kg/m as calculated from the following:   Height as of this encounter: 5' 5"$  (1.651 m).   Weight as of this encounter: 249 lb (112.9 kg).  Risk Assessment: Allergies: Reviewed. She has No Known Allergies.  Allergy Precautions: None required Coagulopathies: Reviewed. None identified.  Blood-thinner therapy: None at this time Active Infection(s): Reviewed. None identified. Ms. Patient is afebrile  Site Confirmation: Sue Cook was asked to confirm the procedure and laterality before marking the site Procedure checklist: Completed Consent: Before the procedure and under the influence of no sedative(s), amnesic(s), or anxiolytics, the  patient was informed of the treatment options, risks and possible complications. To fulfill our ethical and legal obligations, as recommended by the American Medical Association's Code of Ethics, I have informed the patient of my clinical impression; the nature and purpose of the treatment or procedure; the risks, benefits, and possible complications of the intervention; the alternatives, including doing nothing; the risk(s) and benefit(s) of the alternative treatment(s) or procedure(s); and the risk(s) and benefit(s) of doing nothing. The patient was provided information about the general risks and possible complications associated with the procedure. These may include, but are not limited to: failure to achieve desired goals, infection, bleeding, organ or nerve damage, allergic reactions, paralysis, and death. In addition, the patient was informed of those risks and complications associated to the procedure, such as failure to decrease pain; infection; bleeding; organ or nerve damage with subsequent damage to sensory, motor, and/or autonomic systems, resulting in permanent pain, numbness, and/or weakness of one or several areas of the body; allergic reactions; (i.e.: anaphylactic reaction); and/or death. Furthermore, the patient was informed of those risks and complications associated with the medications. These include, but are not limited to: allergic reactions (i.e.: anaphylactic or anaphylactoid reaction(s)); adrenal axis suppression; blood sugar elevation that in diabetics may result in ketoacidosis or comma; water retention that in patients with history of congestive heart failure may result in shortness of breath, pulmonary edema, and decompensation with resultant heart failure; weight gain; swelling or edema; medication-induced neural toxicity; particulate matter embolism and blood vessel occlusion with resultant organ, and/or nervous system infarction; and/or aseptic necrosis of one or more  joints. Finally, the patient was informed that Medicine is not an exact science; therefore, there is also the possibility of unforeseen or unpredictable risks and/or possible complications that may result in a catastrophic outcome. The patient indicated having understood very clearly. We have given the patient no guarantees and we have made no promises. Enough time was given to  the patient to ask questions, all of which were answered to the patient's satisfaction. Ms. Shirrell has indicated that she wanted to continue with the procedure. Attestation: I, the ordering provider, attest that I have discussed with the patient the benefits, risks, side-effects, alternatives, likelihood of achieving goals, and potential problems during recovery for the procedure that I have provided informed consent. Date  Time: 03/06/2022  8:16 AM  Pre-Procedure Preparation:  Monitoring: As per clinic protocol. Respiration, ETCO2, SpO2, BP, heart rate and rhythm monitor placed and checked for adequate function Safety Precautions: Patient was assessed for positional comfort and pressure points before starting the procedure. Time-out: I initiated and conducted the "Time-out" before starting the procedure, as per protocol. The patient was asked to participate by confirming the accuracy of the "Time Out" information. Verification of the correct person, site, and procedure were performed and confirmed by me, the nursing staff, and the patient. "Time-out" conducted as per Joint Commission's Universal Protocol (UP.01.01.01). Time: 0850  Description/Narrative of Procedure:          Rationale (medical necessity): procedure needed and proper for the diagnosis and/or treatment of the patient's medical symptoms and needs. Procedural Technique Safety Precautions: Aspiration looking for blood return was conducted prior to all injections. At no point did we inject any substances, as a needle was being advanced. No attempts were made at  seeking any paresthesias. Safe injection practices and needle disposal techniques used. Medications properly checked for expiration dates. SDV (single dose vial) medications used. Description of the Procedure: Protocol guidelines were followed. The patient was assisted into a comfortable position. The target area was identified and the area prepped in the usual manner. Skin & deeper tissues infiltrated with local anesthetic. Appropriate amount of time allowed to pass for local anesthetics to take effect. The procedure needles were then advanced to the target area. Proper needle placement secured. Negative aspiration confirmed. Solution injected in intermittent fashion, asking for systemic symptoms every 0.5cc of injectate. The needles were then removed and the area cleansed, making sure to leave some of the prepping solution back to take advantage of its long term bactericidal properties.  3 cc of nerve block solution injected at each level             Vitals:   03/06/22 0839 03/06/22 0845 03/06/22 0850 03/06/22 0855  BP: 130/77 126/80 133/81 (!) 147/88  Pulse:      Resp: 16 17 18 16  $ Temp:      SpO2: 100% 100% 100% 100%  Weight:      Height:         Start Time: 0850 hrs. End Time: 0855 hrs.  Imaging Guidance (Non-Spinal):          Type of Imaging Technique: Fluoroscopy Guidance (Non-Spinal) Indication(s): Assistance in needle guidance and placement for procedures requiring needle placement in or near specific anatomical locations not easily accessible without such assistance. Exposure Time: Please see nurses notes. Contrast: None used. Fluoroscopic Guidance: I was personally present during the use of fluoroscopy. "Tunnel Vision Technique" used to obtain the best possible view of the target area. Parallax error corrected before commencing the procedure. "Direction-depth-direction" technique used to introduce the needle under continuous pulsed fluoroscopy. Once target was reached,  antero-posterior, oblique, and lateral fluoroscopic projection used confirm needle placement in all planes. Images permanently stored in EMR. Interpretation: No contrast injected. I personally interpreted the imaging intraoperatively. Adequate needle placement confirmed in multiple planes. Permanent images saved into the patient's record.  Post-operative  Assessment:  Post-procedure Vital Signs:  Pulse/HCG Rate: 7677 Temp: 98.1 F (36.7 C) Resp: 16 BP: (!) 147/88 SpO2: 100 %  EBL: None  Complications: No immediate post-treatment complications observed by team, or reported by patient.  Note: The patient tolerated the entire procedure well. A repeat set of vitals were taken after the procedure and the patient was kept under observation following institutional policy, for this type of procedure. Post-procedural neurological assessment was performed, showing return to baseline, prior to discharge. The patient was provided with post-procedure discharge instructions, including a section on how to identify potential problems. Should any problems arise concerning this procedure, the patient was given instructions to immediately contact us, at any time, without hesitation. In any case, we plan to contact the patient by telephone for a follow-up status report regarding this interventional procedure.  Comments:  No additional relevant information.  Plan of Care (POC)  Orders:  Orders Placed This Encounter  Procedures   DG PAIN [facility] C-ARM 1-60 MIN NO REPORT    Intraoperative interpretation by procedural physician at [place].    Standing Status:   Standing    Number of Occurrences:   1    Order Specific Question:   Reason for exam:    Answer:   Assistance in needle guidance and placement for procedures requiring needle placement in or near specific anatomical locations not easily accessible without such assistance.     Medications ordered for procedure: Meds ordered this  encounter  Medications   lidocaine (XYLOCAINE) 2 % (with pres) injection 400 mg   dexamethasone (DECADRON) injection 10 mg   ropivacaine (PF) 2 mg/mL (0.2%) (NAROPIN) injection 9 mL   Medications administered: We administered lidocaine, dexamethasone, and ropivacaine (PF) 2 mg/mL (0.2%).  See the medical record for exact dosing, route, and time of administration.  Follow-up plan:   Return in about 4 weeks (around 04/03/2022) for Post Procedure Evaluation, in person.       Right L3 LSB 10/24/21, 11/28/21 , [place] SCS trial 02/11/22, R GNB 03/06/22      Recent Visits Date Type Provider Dept  02/18/22 Procedure visit Gillis Santa, MD [facility]  02/11/22 Procedure visit Gillis Santa, MD [facility]  01/09/22 Office Visit Gillis Santa, MD [facility]  Showing recent visits within past 90 days and meeting all other requirements Today's Visits Date Type Provider Dept  03/06/22 Procedure visit Gillis Santa, MD [facility]  Showing today's visits and meeting all other requirements Future Appointments Date Type Provider Dept  04/03/22 Appointment Gillis Santa, MD [facility]  Showing future appointments within next 90 days and meeting all other requirements  Disposition: Discharge home  Discharge (Date  Time): 03/06/2022; 0905 hrs.   Primary Care Physician: Derinda Late, MD Location: [facility] Note by: Gillis Santa, MD (TTS technology used. I apologize for any typographical errors that were not detected and corrected.) Date: 03/06/2022; Time: 9:10 AM  Disclaimer:  Medicine is not an Chief Strategy Officer. The only guarantee in medicine is that nothing is guaranteed. It is important to note that the decision to proceed with this intervention was based on the information collected from the patient. The Data and conclusions were drawn from the patient's questionnaire, the interview, and the physical  examination. Because the information was provided in large part by the patient, it cannot be guaranteed that it has not been purposely or unconsciously manipulated. Every effort has been made to obtain as much relevant  data as possible for this evaluation. It is important to note that the conclusions that lead to this procedure are derived in large part from the available data. Always take into account that the treatment will also be dependent on availability of resources and existing treatment guidelines, considered by other Pain Management Practitioners as being common knowledge and practice, at the time of the intervention. For Medico-Legal purposes, it is also important to point out that variation in procedural techniques and pharmacological choices are the acceptable norm. The indications, contraindications, technique, and results of the above procedure should only be interpreted and judged by a Board-Certified Interventional Pain Specialist with extensive familiarity and expertise in the same exact procedure and technique.

## 2022-03-07 ENCOUNTER — Telehealth: Payer: Self-pay | Admitting: *Deleted

## 2022-03-07 ENCOUNTER — Other Ambulatory Visit: Payer: Self-pay | Admitting: Family Medicine

## 2022-03-07 ENCOUNTER — Ambulatory Visit: Payer: Managed Care, Other (non HMO) | Admitting: Student in an Organized Health Care Education/Training Program

## 2022-03-07 DIAGNOSIS — Z1231 Encounter for screening mammogram for malignant neoplasm of breast: Secondary | ICD-10-CM

## 2022-03-07 NOTE — Telephone Encounter (Signed)
No problems post procedure. 

## 2022-03-19 ENCOUNTER — Ambulatory Visit: Payer: Managed Care, Other (non HMO)

## 2022-03-19 ENCOUNTER — Ambulatory Visit
Admission: RE | Admit: 2022-03-19 | Discharge: 2022-03-19 | Disposition: A | Discharge status: Home / Self Care | Payer: Managed Care, Other (non HMO) | Source: Ambulatory Visit | Attending: Family Medicine | Admitting: Family Medicine

## 2022-03-19 DIAGNOSIS — Z1231 Encounter for screening mammogram for malignant neoplasm of breast: Secondary | ICD-10-CM | POA: Diagnosis present

## 2022-04-03 ENCOUNTER — Ambulatory Visit: Payer: Managed Care, Other (non HMO) | Admitting: Student in an Organized Health Care Education/Training Program

## 2022-04-04 ENCOUNTER — Ambulatory Visit
Payer: Managed Care, Other (non HMO) | Attending: Student in an Organized Health Care Education/Training Program | Admitting: Student in an Organized Health Care Education/Training Program

## 2022-04-04 ENCOUNTER — Encounter: Payer: Self-pay | Admitting: Student in an Organized Health Care Education/Training Program

## 2022-04-04 VITALS — BP 142/93 | HR 77 | Temp 98.2°F | Resp 16 | Ht 65.0 in | Wt 250.0 lb

## 2022-04-04 DIAGNOSIS — G8929 Other chronic pain: Secondary | ICD-10-CM | POA: Insufficient documentation

## 2022-04-04 DIAGNOSIS — M25561 Pain in right knee: Secondary | ICD-10-CM

## 2022-04-04 DIAGNOSIS — G90521 Complex regional pain syndrome I of right lower limb: Secondary | ICD-10-CM

## 2022-04-04 DIAGNOSIS — M1711 Unilateral primary osteoarthritis, right knee: Secondary | ICD-10-CM | POA: Diagnosis not present

## 2022-04-04 NOTE — Progress Notes (Signed)
PROVIDER NOTE: Information contained herein reflects review and annotations entered in association with encounter. Interpretation of such information and data should be left to medically-trained personnel. Information provided to patient can be located elsewhere in the medical record under "Patient Instructions". Document created using STT-dictation technology, any transcriptional errors that may result from process are unintentional.    Patient: Sue Cook  Service Category: E/M  Provider: Gillis Santa, MD  DOB: 11/24/1980  DOS: 04/04/2022  Referring Provider: Derinda Late, MD  MRN: [id number]  Specialty: Interventional Pain Management  PCP: Derinda Late, MD  Type: Established Patient  Setting: Ambulatory outpatient    Location: Office  Delivery: Face-to-face     HPI  Ms. Sue Cook, a 42 y.o. year old female, is here today because of her Chronic pain of right knee [M25.561, G89.29]. [name] primary complain today is Knee Pain (right)  Pertinent problems: [name] has Complex regional pain syndrome type 1 of right lower extremity; Chronic pain syndrome; Chronic pain of right knee; and Primary osteoarthritis of right knee on their pertinent problem list. Pain Assessment: Severity of Chronic pain is reported as a 3 /10. Location: Knee Right/denies. Onset: More than a month ago. Quality: Nagging, Sharp. Timing: Constant. Modifying factor(s): sitting with right leg extended. Vitals:  height is '5\' 5"'$  (1.651 m) and weight is 250 lb (113.4 kg). Her temporal temperature is 98.2 F (36.8 C). Her blood pressure is 142/93 (abnormal) and her pulse is 77. Her respiration is 16 and oxygen saturation is 99%.  BMI: Estimated body mass index is 41.6 kg/m as calculated from the following:   Height as of this encounter: '5\' 5"'$  (1.651 m).   Weight as of this encounter: 250 lb (113.4 kg). Last encounter: 01/09/2022. Last procedure: 03/06/2022.  Reason for encounter: post-procedure evaluation and  assessment.  Patient presents today for postprocedural evaluation after right genicular nerve block which unfortunately was not helpful for her persistent right knee pain.  She has had her spinal cord stimulator implant with Dr. Mechele Dawley.  Appreciate his involvement in patient's care.  She states that she is noticing benefit for her right lower extremity CRPS after implant and has also noticed improved right knee range of motion after the implant.  She continues to endorse moderate to severe medial knee pain that is worse in the evening and is impacting her sleep.  Patient could have meniscus derangement/injury or even ligamentous tear.  Recommend right knee MRI for further workup.   Post-procedure evaluation   Type: Genicular Nerves Block (Superolateral, Superomedial, and Inferomedial Genicular Nerves)  #1  Laterality: Right (-RT)  Level: Superior and inferior to the knee joint.  Imaging: Fluoroscopic guidance Anesthesia: Local anesthesia (1-2% Lidocaine) DOS: 03/06/2022  Performed by: Gillis Santa, MD  Purpose: Diagnostic/Therapeutic Indications: Chronic knee pain severe enough to impact quality of life or function. Rationale (medical necessity): procedure needed and proper for the diagnosis and/or treatment of [name] medical symptoms and needs. 1. Chronic pain of right knee   2. Primary osteoarthritis of right knee    NAS-11 Pain score:   Pre-procedure: 5 /10   Post-procedure: 3 /10      Effectiveness:  Initial hour after procedure: 100 %  Subsequent 4-6 hours post-procedure: 95 %  Analgesia past initial 6 hours: 0 %  Ongoing improvement:  Analgesic:  0%  ROS  Constitutional: Denies any fever or chills Gastrointestinal: No reported hemesis, hematochezia, vomiting, or acute GI distress Musculoskeletal:  Right knee pain, medial aspect Neurological: No  reported episodes of acute onset apraxia, aphasia, dysarthria, agnosia, amnesia, paralysis, loss of coordination, or loss of  consciousness  Medication Review  Cholecalciferol, SUMAtriptan, Semaglutide-Weight Management, buPROPion, cyanocobalamin, multivitamin with minerals, and naproxen sodium  History Review  Allergy: [name] has No Known Allergies. Drug: [name]  reports no history of drug use. Alcohol:  reports current alcohol use. Tobacco:  reports that she has never smoked. She has never been exposed to tobacco smoke. She has never used smokeless tobacco. Social: [name]  reports that she has never smoked. She has never been exposed to tobacco smoke. She has never used smokeless tobacco. She reports current alcohol use. She reports that she does not use drugs. Medical:  has a past medical history of Ankle pain (0000000), Complication of anesthesia, GERD (gastroesophageal reflux disease), Headache, and PONV (postoperative nausea and vomiting). Surgical: [name]  has a past surgical history that includes Abdominal hysterectomy; Wisdom tooth extraction; Endometrial biopsy; Cholecystectomy (N/A, 06/11/2016); Foot surgery (Right); and Carpal tunnel release (Bilateral). Family: family history includes Breast cancer in her maternal aunt and maternal grandmother; Cancer in her mother; Diabetes in her sister; Heart attack in her father, maternal grandfather, maternal grandmother, and paternal grandfather.  Laboratory Chemistry Profile   Renal Lab Results  Component Value Date   BUN 18 03/27/2021   CREATININE 0.82 03/27/2021   BCR 22 03/27/2021   GFRAA 113 02/11/2018   GFRNONAA 98 02/11/2018    Hepatic Lab Results  Component Value Date   AST 12 02/11/2018   ALT 20 02/11/2018   ALBUMIN 4.7 02/11/2018   ALKPHOS 66 02/11/2018    Electrolytes Lab Results  Component Value Date   NA 141 03/27/2021   K 4.4 03/27/2021   CL 101 03/27/2021   CALCIUM 9.4 03/27/2021   MG 2.1 03/27/2021    Bone Lab Results  Component Value Date   VD25OH 34.5 03/27/2021    Inflammation (CRP: Acute Phase) (ESR:  Chronic Phase) No results found for: "CRP", "ESRSEDRATE", "LATICACIDVEN"       Note: Above Lab results reviewed.  Recent Imaging Review  MM 3D SCREEN BREAST BILATERAL CLINICAL DATA:  Screening.  EXAM: DIGITAL SCREENING BILATERAL MAMMOGRAM WITH TOMOSYNTHESIS AND CAD  TECHNIQUE: Bilateral screening digital craniocaudal and mediolateral oblique mammograms were obtained. Bilateral screening digital breast tomosynthesis was performed. The images were evaluated with computer-aided detection.  COMPARISON:  Previous exam(s).  ACR Breast Density Category b: There are scattered areas of fibroglandular density.  FINDINGS: There are no findings suspicious for malignancy.  IMPRESSION: No mammographic evidence of malignancy. A result letter of this screening mammogram will be mailed directly to the patient.  RECOMMENDATION: Screening mammogram in one year. (Code:SM-B-01Y)  BI-RADS CATEGORY  1: Negative.  Electronically Signed   By: Claudie Revering M.D.   On: 03/21/2022 12:40 Note: Reviewed        Physical Exam  General appearance: Well nourished, well developed, and well hydrated. In no apparent acute distress Mental status: Alert, oriented x 3 (person, place, & time)       Respiratory: No evidence of acute respiratory distress Eyes: PERLA Vitals: BP (!) 142/93   Pulse 77   Temp 98.2 F (36.8 C) (Temporal)   Resp 16   Ht '5\' 5"'$  (1.651 m)   Wt 250 lb (113.4 kg)   SpO2 99%   BMI 41.60 kg/m  BMI: Estimated body mass index is 41.6 kg/m as calculated from the following:   Height as of this encounter: '5\' 5"'$  (1.651  m).   Weight as of this encounter: 250 lb (113.4 kg). Ideal: Ideal body weight: 57 kg (125 lb 10.6 oz) Adjusted ideal body weight: 79.6 kg (175 lb 6.4 oz)  IPG palpable scar is healing appropriately, incision site clean, dry, not erythematous  Right knee pain, medial region  Improve right knee range of motion  Assessment   Diagnosis Status  1. Chronic pain of  right knee   2. Primary osteoarthritis of right knee   3. Complex regional pain syndrome type 1 of right lower extremity    Persistent Persistent Responding    Plan of Care    Orders:  Orders Placed This Encounter  Procedures   MR KNEE RIGHT WO CONTRAST    Standing Status:   Future    Standing Expiration Date:   05/05/2022    Scheduling Instructions:     Please make sure that the patient understands that this needs to be done as soon as possible. Never have the patient do the imaging "just before the next appointment". Inform patient that having the imaging done within the [facility] will expedite the availability of the results and will provide      imaging availability to the requesting physician. In addition inform the patient that the imaging order has an expiration date and will not be renewed if not done within the active period.    Order Specific Question:   What is the patient's sedation requirement?    Answer:   No Sedation    Order Specific Question:   Does the patient have a pacemaker or implanted devices?    Answer:   No    Order Specific Question:   Preferred imaging location?    Answer:   [facility] (table limit-350lbs)    Order Specific Question:   Call Results- Best Contact Number?    Answer:   [phone] ([facility])    Order Specific Question:   Radiology Contrast Protocol - do NOT remove file path    Answer:   [email or web address]   Follow-up plan:   Return for I will call pt with MRI results.      Right L3 LSB 10/24/21, 11/28/21 , [place] SCS trial 02/11/22, R GNB 03/06/22       Recent Visits Date Type Provider Dept  03/06/22 Procedure visit Gillis Santa, MD [facility]  02/18/22 Procedure visit Gillis Santa, MD [facility]  02/11/22 Procedure visit Gillis Santa, MD [facility]  01/09/22 Office Visit Gillis Santa, MD [facility]  Showing recent visits within past 90  days and meeting all other requirements Today's Visits Date Type Provider Dept  04/04/22 Office Visit Gillis Santa, MD [facility]  Showing today's visits and meeting all other requirements Future Appointments No visits were found meeting these conditions. Showing future appointments within next 90 days and meeting all other requirements  I discussed the assessment and treatment plan with the patient. The patient was provided an opportunity to ask questions and all were answered. The patient agreed with the plan and demonstrated an understanding of the instructions.  Patient advised to call back or seek an in-person evaluation if the symptoms or condition worsens.  Duration of encounter: 20 minutes.  Total time on encounter, as per AMA guidelines included both the face-to-face and non-face-to-face time personally spent by the physician and/or other qualified health care professional(s) on the day of the encounter (includes time in activities that require the physician or other qualified health care  professional and does not include time in activities normally performed by clinical staff). Physician's time may include the following activities when performed: Preparing to see the patient (e.g., pre-charting review of records, searching for previously ordered imaging, lab work, and nerve conduction tests) Review of prior analgesic pharmacotherapies. Reviewing PMP Interpreting ordered tests (e.g., lab work, imaging, nerve conduction tests) Performing post-procedure evaluations, including interpretation of diagnostic procedures Obtaining and/or reviewing separately obtained history Performing a medically appropriate examination and/or evaluation Counseling and educating the patient/family/caregiver Ordering medications, tests, or procedures Referring and communicating with other health care professionals (when not separately reported) Documenting clinical information in the electronic  or other health record Independently interpreting results (not separately reported) and communicating results to the patient/ family/caregiver Care coordination (not separately reported)  Note by: Gillis Santa, MD Date: 04/04/2022; Time: 3:52 PM

## 2022-04-23 ENCOUNTER — Ambulatory Visit
Admission: RE | Admit: 2022-04-23 | Discharge: 2022-04-23 | Disposition: A | Discharge status: Home / Self Care | Payer: Managed Care, Other (non HMO) | Source: Ambulatory Visit | Attending: Student in an Organized Health Care Education/Training Program | Admitting: Student in an Organized Health Care Education/Training Program

## 2022-04-23 DIAGNOSIS — G8929 Other chronic pain: Secondary | ICD-10-CM | POA: Insufficient documentation

## 2022-04-23 DIAGNOSIS — M25561 Pain in right knee: Secondary | ICD-10-CM | POA: Diagnosis not present

## 2022-04-23 DIAGNOSIS — M1711 Unilateral primary osteoarthritis, right knee: Secondary | ICD-10-CM | POA: Diagnosis present

## 2022-04-25 ENCOUNTER — Telehealth: Payer: Self-pay | Admitting: *Deleted

## 2022-04-25 DIAGNOSIS — M1711 Unilateral primary osteoarthritis, right knee: Secondary | ICD-10-CM

## 2022-04-25 DIAGNOSIS — M23203 Derangement of unspecified medial meniscus due to old tear or injury, right knee: Secondary | ICD-10-CM

## 2022-04-25 DIAGNOSIS — G8929 Other chronic pain: Secondary | ICD-10-CM

## 2022-04-25 NOTE — Telephone Encounter (Signed)
X-ray results read to patient. She does want a referral to ortho. She doesn't have a preference of whom.

## 2022-04-25 NOTE — Telephone Encounter (Signed)
-----   Message from Gillis Santa, MD sent at 04/24/2022  3:46 PM EDT ----- Regarding: please call pt with xray results, recommend referral to Orthopedics for surgical consult If patient would like me to referral to orthopedics, please let me know ----- Message ----- From: Interface, Rad Results In Sent: 04/24/2022   1:03 PM EDT To: Gillis Santa, MD

## 2022-04-29 DIAGNOSIS — M23203 Derangement of unspecified medial meniscus due to old tear or injury, right knee: Secondary | ICD-10-CM | POA: Insufficient documentation

## 2022-04-29 NOTE — Telephone Encounter (Signed)
Patient notified

## 2022-04-29 NOTE — Addendum Note (Signed)
Addended by: Edward Jolly on: 04/29/2022 11:03 AM   Modules accepted: Orders

## 2022-05-07 ENCOUNTER — Ambulatory Visit: Payer: Managed Care, Other (non HMO) | Admitting: Podiatry

## 2022-05-10 ENCOUNTER — Ambulatory Visit (INDEPENDENT_AMBULATORY_CARE_PROVIDER_SITE_OTHER): Payer: Managed Care, Other (non HMO) | Admitting: Podiatry

## 2022-05-10 DIAGNOSIS — G90521 Complex regional pain syndrome I of right lower limb: Secondary | ICD-10-CM

## 2022-05-10 DIAGNOSIS — G709 Myoneural disorder, unspecified: Secondary | ICD-10-CM

## 2022-05-10 NOTE — Progress Notes (Unsigned)
  Subjective:  Patient ID: Sue Cook, female    DOB: 01/17/1981,  MRN: [id number]  Chief Complaint  Patient presents with   Complex regional pain syndrome type 1 of right lower extrem    Pt stated that she has good days and bad ones     DOS: 12/11/2020 Procedure: Right peroneal tendon repair with tenosynovectomy  42 y.o. female presents with same complaint of foot being locked up.  She is states she went to go see Dr. Lourdes Sledge for pain management.  She states that she is doing a lot better.  The injection did help.   Review of Systems: Negative except as noted in the HPI. Denies N/V/F/Ch.  Past Medical History:  Diagnosis Date   Ankle pain 05/2016   RIGHT-PT SEEING DR Ether Griffins AND GETTING CORTISONE INJECTIONS   Complication of anesthesia    GERD (gastroesophageal reflux disease)    rare-NO MEDS   Headache    MIGRAINES   PONV (postoperative nausea and vomiting)     Current Outpatient Medications:    buPROPion (WELLBUTRIN XL) 150 MG 24 hr tablet, Take by mouth. (Patient not taking: Reported on 03/06/2022), Disp: , Rfl:    cholecalciferol (VITAMIN D3) 25 MCG (1000 UNIT) tablet, Take 1,000 Units by mouth daily., Disp: , Rfl:    cyanocobalamin 2000 MCG tablet, Take 2,000 mcg by mouth daily., Disp: , Rfl:    Multiple Vitamin (MULTIVITAMIN WITH MINERALS) TABS tablet, Take 1 tablet by mouth daily., Disp: , Rfl:    naproxen sodium (ALEVE) 220 MG tablet, Take 220 mg by mouth daily as needed., Disp: , Rfl:    Semaglutide-Weight Management 0.5 MG/0.5ML SOAJ, Inject into the skin. (Patient not taking: Reported on 02/11/2022), Disp: , Rfl:    SUMAtriptan (IMITREX) 100 MG tablet, , Disp: , Rfl: 0  Social History   Tobacco Use  Smoking Status Never   Passive exposure: Never  Smokeless Tobacco Never    No Known Allergies Objective:   There were no vitals filed for this visit.  There is no height or weight on file to calculate BMI. Constitutional Well developed. Well nourished.   Vascular Foot warm and well perfused. Capillary refill normal to all digits.   Neurologic Normal speech. Oriented to person, place, and time. Epicritic sensation to light touch grossly present bilaterally.  Dermatologic No pain along the course of the surgical site/peroneal tendon.  Some weakness noted to the peroneal tendon.  Generalized pain in the leg and the lower extremity noted.  Unable to recreate the locked of the foot.  She  Orthopedic: Mild tenderness to palpation noted about the surgical site.   Radiographs: None Assessment:   No diagnosis found.          Plan:  Patient was evaluated and treated and all questions answered.  S/p foot surgery right -Clinically doing well from the surgical site standpoint however her foot is still hurting and it is very frustrating for her not to be able to figure out.  Right foot locking up/neuromuscular issues/CRPS -I explained the patient the etiology of neuromuscular disease/CRPS and worse treatment options were discussed. -Continue physical therapy -Patient is being primarily managed at the pain management for CRPS.  The block is helping with her pain. -At this time patient we will continue long-term disability indefinitely.  At this time patient will follow-up with me every 6 months to a year to evaluate.  No follow-ups on file.

## 2022-05-13 ENCOUNTER — Other Ambulatory Visit: Payer: Self-pay | Admitting: Orthopedic Surgery

## 2022-05-14 ENCOUNTER — Encounter: Payer: Self-pay | Admitting: Student in an Organized Health Care Education/Training Program

## 2022-05-14 ENCOUNTER — Other Ambulatory Visit: Payer: Self-pay | Admitting: Orthopedic Surgery

## 2022-05-16 ENCOUNTER — Other Ambulatory Visit: Payer: Self-pay

## 2022-05-16 ENCOUNTER — Encounter
Admission: RE | Admit: 2022-05-16 | Discharge: 2022-05-16 | Disposition: A | Discharge status: Home / Self Care | Payer: Managed Care, Other (non HMO) | Source: Ambulatory Visit | Attending: Orthopedic Surgery | Admitting: Orthopedic Surgery

## 2022-05-16 NOTE — Patient Instructions (Signed)
Your procedure is scheduled on: Tuesday 05/21/22 To find out your arrival time, please call [phone] between 1PM - 3PM on:   Monday 05/20/22 Report to the Registration Desk on the 1st floor of the [facility]. Valet parking is available.  If your arrival time is 6:00 am, do not arrive before that time as the [facility] entrance doors do not open until 6:00 am.  REMEMBER: Instructions that are not followed completely may result in serious medical risk, up to and including death; or upon the discretion of your surgeon and anesthesiologist your surgery may need to be rescheduled.  Do not eat food after midnight the night before surgery.  No gum chewing or hard candies.  You may however, drink CLEAR liquids up to 2 hours before you are scheduled to arrive for your surgery. Do not drink anything within 2 hours of your scheduled arrival time.  Clear liquids include: - water  - apple juice without pulp - gatorade (not RED colors) - black coffee or tea (Do NOT add milk or creamers to the coffee or tea) Do NOT drink anything that is not on this list.  Type 1 and Type 2 diabetics should only drink water.  In addition, your doctor has ordered for you to drink the provided:  Ensure Pre-Surgery Clear Carbohydrate Drink  Drinking this carbohydrate drink up to two hours before surgery helps to reduce insulin resistance and improve patient outcomes. Please complete drinking 2 hours before scheduled arrival time.  One week prior to surgery: Stop Anti-inflammatories (NSAIDS) such as Advil, Aleve, Ibuprofen, Motrin, Naproxen, Naprosyn and Aspirin based products such as Excedrin, Goody's Powder, BC Powder. You may however, continue to take Tylenol if needed for pain up until the day of surgery.  Stop ANY OVER THE COUNTER supplements or vitamins until after surgery.  Continue taking all prescribed medications with the exception of the following: Wegovy, hold until after surgery. (Last dose was  05/12/22 per patient)  **Follow guidelines for insulin and diabetes medications**  TAKE ONLY THESE MEDICATIONS THE MORNING OF SURGERY WITH A SIP OF WATER:  none  No Alcohol for 24 hours before or after surgery.  No Smoking including e-cigarettes for 24 hours before surgery.  No chewable tobacco products for at least 6 hours before surgery.  No nicotine patches on the day of surgery.  Do not use any "recreational" drugs for at least a week (preferably 2 weeks) before your surgery.  Please be advised that the combination of cocaine and anesthesia may have negative outcomes, up to and including death. If you test positive for cocaine, your surgery will be cancelled.  On the morning of surgery brush your teeth with toothpaste and water, you may rinse your mouth with mouthwash if you wish. Do not swallow any toothpaste or mouthwash.  Use CHG Soap or wipes as directed on instruction sheet.  Do not wear lotions, powders, or perfumes.   Do not shave body hair from the neck down 48 hours before surgery.  Wear comfortable clothing (specific to your surgery type) to the hospital.  Do not wear jewelry, make-up, hairpins, clips or nail polish.  Contact lenses, hearing aids and dentures may not be worn into surgery.  Do not bring valuables to the hospital. [facility] is not responsible for any missing/lost belongings or valuables.   Notify your doctor if there is any change in your medical condition (cold, fever, infection).  If you are being discharged the day of surgery, you will not  be allowed to drive home. You will need a responsible individual to drive you home and stay with you for 24 hours after surgery.   If you are taking public transportation, you will need to have a responsible individual with you.  If you are being admitted to the hospital overnight, leave your suitcase in the car. After surgery it may be brought to your room.  In case of increased patient census, it may be  necessary for you, the patient, to continue your postoperative care in the Same Day Surgery department.  After surgery, you can help prevent lung complications by doing breathing exercises.  Take deep breaths and cough every 1-2 hours. Your doctor may order a device called an Incentive Spirometer to help you take deep breaths. When coughing or sneezing, hold a pillow firmly against your incision with both hands. This is called "splinting." Doing this helps protect your incision. It also decreases belly discomfort.  Surgery Visitation Policy:  Patients undergoing a surgery or procedure may have two family members or support persons with them as long as the person is not COVID-19 positive or experiencing its symptoms.   Inpatient Visitation:    Visiting hours are 7 a.m. to 8 p.m. Up to four visitors are allowed at one time in a patient room. The visitors may rotate out with other people during the day. One designated support person (adult) may remain overnight.  Please call the Pre-admissions Testing Dept. at [phone] if you have any questions about these instructions.     Preparing for Surgery with CHLORHEXIDINE GLUCONATE (CHG) Soap  Chlorhexidine Gluconate (CHG) Soap  o An antiseptic cleaner that kills germs and bonds with the skin to continue killing germs even after washing  o Used for showering the night before surgery and morning of surgery  Before surgery, you can play an important role by reducing the number of germs on your skin.  CHG (Chlorhexidine gluconate) soap is an antiseptic cleanser which kills germs and bonds with the skin to continue killing germs even after washing.  Please do not use if you have an allergy to CHG or antibacterial soaps. If your skin becomes reddened/irritated stop using the CHG.  1. Shower the NIGHT BEFORE SURGERY and the MORNING OF SURGERY with CHG soap.  2. If you choose to wash your hair, wash your hair first as usual with your normal  shampoo.  3. After shampooing, rinse your hair and body thoroughly to remove the shampoo.  4. Use CHG as you would any other liquid soap. You can apply CHG directly to the skin and wash gently with a scrungie or a clean washcloth.  5. Apply the CHG soap to your body only from the neck down. Do not use on open wounds or open sores. Avoid contact with your eyes, ears, mouth, and genitals (private parts). Wash face and genitals (private parts) with your normal soap.  6. Wash thoroughly, paying special attention to the area where your surgery will be performed.  7. Thoroughly rinse your body with warm water.  8. Do not shower/wash with your normal soap after using and rinsing off the CHG soap.  9. Pat yourself dry with a clean towel.  10. Wear clean pajamas to bed the night before surgery.  12. Place clean sheets on your bed the night of your first shower and do not sleep with pets.  13. Shower again with the CHG soap on the day of surgery prior to arriving at the hospital.  14. Do not apply any deodorants/lotions/powders.  15. Please wear clean clothes to the hospital.

## 2022-05-21 ENCOUNTER — Encounter
Admission: RE | Disposition: A | Discharge status: Home / Self Care | Payer: Self-pay | Source: Home / Self Care | Attending: Orthopedic Surgery

## 2022-05-21 ENCOUNTER — Ambulatory Visit: Payer: Managed Care, Other (non HMO) | Admitting: Certified Registered Nurse Anesthetist

## 2022-05-21 ENCOUNTER — Ambulatory Visit
Admission: RE | Admit: 2022-05-21 | Discharge: 2022-05-21 | Disposition: A | Discharge status: Home / Self Care | Payer: Managed Care, Other (non HMO) | Attending: Orthopedic Surgery | Admitting: Orthopedic Surgery

## 2022-05-21 ENCOUNTER — Other Ambulatory Visit: Payer: Self-pay

## 2022-05-21 ENCOUNTER — Ambulatory Visit: Payer: Managed Care, Other (non HMO)

## 2022-05-21 DIAGNOSIS — S83231A Complex tear of medial meniscus, current injury, right knee, initial encounter: Secondary | ICD-10-CM | POA: Diagnosis present

## 2022-05-21 DIAGNOSIS — Z7985 Long-term (current) use of injectable non-insulin antidiabetic drugs: Secondary | ICD-10-CM | POA: Insufficient documentation

## 2022-05-21 DIAGNOSIS — Z9682 Presence of neurostimulator: Secondary | ICD-10-CM | POA: Diagnosis not present

## 2022-05-21 DIAGNOSIS — R519 Headache, unspecified: Secondary | ICD-10-CM | POA: Diagnosis not present

## 2022-05-21 DIAGNOSIS — X58XXXA Exposure to other specified factors, initial encounter: Secondary | ICD-10-CM | POA: Insufficient documentation

## 2022-05-21 DIAGNOSIS — Z6841 Body Mass Index (BMI) 40.0 and over, adult: Secondary | ICD-10-CM | POA: Insufficient documentation

## 2022-05-21 HISTORY — PX: KNEE ARTHROSCOPY WITH MEDIAL MENISECTOMY: SHX5651

## 2022-05-21 SURGERY — ARTHROSCOPY, KNEE, WITH MEDIAL MENISCECTOMY
Anesthesia: Regional | Site: Knee | Laterality: Right

## 2022-05-21 MED ORDER — IBUPROFEN 800 MG PO TABS: 800.0000 mg | ORAL_TABLET | Freq: Three times a day (TID) | ORAL | 0 refills | Status: AC

## 2022-05-21 MED ORDER — ACETAMINOPHEN 500 MG PO TABS: 1000.0000 mg | ORAL_TABLET | Freq: Three times a day (TID) | ORAL | 2 refills | Status: AC

## 2022-05-21 MED ORDER — DEXMEDETOMIDINE HCL IN NACL 200 MCG/50ML IV SOLN
INTRAVENOUS | Status: DC | PRN
  Administered 2022-05-21 (×2): 8 ug via INTRAVENOUS

## 2022-05-21 MED ORDER — ONDANSETRON HCL 4 MG/2ML IJ SOLN
INTRAMUSCULAR | Status: DC | PRN
  Administered 2022-05-21: 4 mg via INTRAVENOUS

## 2022-05-21 MED ORDER — BUPIVACAINE HCL (PF) 0.5 % IJ SOLN
INTRAMUSCULAR | Status: DC | PRN
  Administered 2022-05-21: 10 mL

## 2022-05-21 MED ORDER — MIDAZOLAM HCL 2 MG/2ML IJ SOLN
2.0000 mg | Freq: Once | INTRAMUSCULAR | Status: AC
  Administered 2022-05-21: 2 mg via INTRAVENOUS

## 2022-05-21 MED ORDER — ACETAMINOPHEN 10 MG/ML IV SOLN
INTRAVENOUS | Status: AC
  Filled 2022-05-21: qty 100

## 2022-05-21 MED ORDER — CHLORHEXIDINE GLUCONATE 0.12 % MT SOLN
15.0000 mL | Freq: Once | OROMUCOSAL | Status: AC
  Administered 2022-05-21: 15 mL via OROMUCOSAL

## 2022-05-21 MED ORDER — FENTANYL CITRATE PF 50 MCG/ML IJ SOSY
PREFILLED_SYRINGE | INTRAMUSCULAR | Status: AC
  Filled 2022-05-21: qty 1

## 2022-05-21 MED ORDER — CHLORHEXIDINE GLUCONATE 0.12 % MT SOLN
OROMUCOSAL | Status: AC
  Filled 2022-05-21: qty 15

## 2022-05-21 MED ORDER — OXYCODONE HCL 5 MG PO TABS
5.0000 mg | ORAL_TABLET | Freq: Once | ORAL | Status: AC | PRN
  Administered 2022-05-21: 5 mg via ORAL

## 2022-05-21 MED ORDER — MIDAZOLAM HCL 2 MG/2ML IJ SOLN
INTRAMUSCULAR | Status: AC
  Filled 2022-05-21: qty 2

## 2022-05-21 MED ORDER — LACTATED RINGERS IV SOLN: INTRAVENOUS | Status: DC

## 2022-05-21 MED ORDER — DEXAMETHASONE SODIUM PHOSPHATE 10 MG/ML IJ SOLN
INTRAMUSCULAR | Status: AC
  Filled 2022-05-21: qty 1

## 2022-05-21 MED ORDER — ASPIRIN 325 MG PO TBEC: 325.0000 mg | DELAYED_RELEASE_TABLET | Freq: Every day | ORAL | 0 refills | Status: AC

## 2022-05-21 MED ORDER — PROPOFOL 500 MG/50ML IV EMUL
INTRAVENOUS | Status: DC | PRN
  Administered 2022-05-21: 100 ug/kg/min via INTRAVENOUS

## 2022-05-21 MED ORDER — BUPIVACAINE LIPOSOME 1.3 % IJ SUSP
INTRAMUSCULAR | Status: AC
  Filled 2022-05-21: qty 10

## 2022-05-21 MED ORDER — FENTANYL CITRATE (PF) 100 MCG/2ML IJ SOLN
INTRAMUSCULAR | Status: AC
  Filled 2022-05-21: qty 2

## 2022-05-21 MED ORDER — CEFAZOLIN SODIUM-DEXTROSE 2-4 GM/100ML-% IV SOLN
INTRAVENOUS | Status: AC
  Filled 2022-05-21: qty 100

## 2022-05-21 MED ORDER — ONDANSETRON HCL 4 MG PO TABS: 4.0000 mg | ORAL_TABLET | Freq: Three times a day (TID) | ORAL | 0 refills | Status: AC | PRN

## 2022-05-21 MED ORDER — FAMOTIDINE 20 MG PO TABS
ORAL_TABLET | ORAL | Status: AC
  Filled 2022-05-21: qty 1

## 2022-05-21 MED ORDER — OXYCODONE HCL 5 MG/5ML PO SOLN: 5.0000 mg | Freq: Once | ORAL | Status: AC | PRN

## 2022-05-21 MED ORDER — FAMOTIDINE 20 MG PO TABS
20.0000 mg | ORAL_TABLET | Freq: Once | ORAL | Status: AC
  Administered 2022-05-21: 20 mg via ORAL

## 2022-05-21 MED ORDER — DEXMEDETOMIDINE HCL IN NACL 80 MCG/20ML IV SOLN
INTRAVENOUS | Status: AC
  Filled 2022-05-21: qty 20

## 2022-05-21 MED ORDER — SCOPOLAMINE 1 MG/3DAYS TD PT72
1.0000 | MEDICATED_PATCH | TRANSDERMAL | Status: DC
  Administered 2022-05-21: 1.5 mg via TRANSDERMAL

## 2022-05-21 MED ORDER — BUPIVACAINE LIPOSOME 1.3 % IJ SUSP
INTRAMUSCULAR | Status: DC | PRN
  Administered 2022-05-21: 10 mL

## 2022-05-21 MED ORDER — BUPIVACAINE HCL (PF) 0.5 % IJ SOLN
INTRAMUSCULAR | Status: AC
  Filled 2022-05-21: qty 10

## 2022-05-21 MED ORDER — PROPOFOL 1000 MG/100ML IV EMUL
INTRAVENOUS | Status: AC
  Filled 2022-05-21: qty 100

## 2022-05-21 MED ORDER — CEFAZOLIN SODIUM-DEXTROSE 2-4 GM/100ML-% IV SOLN
2.0000 g | INTRAVENOUS | Status: AC
  Administered 2022-05-21: 2 g via INTRAVENOUS

## 2022-05-21 MED ORDER — FENTANYL CITRATE PF 50 MCG/ML IJ SOSY
50.0000 ug | PREFILLED_SYRINGE | Freq: Once | INTRAMUSCULAR | Status: AC
  Administered 2022-05-21: 50 ug via INTRAVENOUS

## 2022-05-21 MED ORDER — ACETAMINOPHEN 10 MG/ML IV SOLN: 1000.0000 mg | Freq: Once | INTRAVENOUS | Status: DC | PRN

## 2022-05-21 MED ORDER — MIDAZOLAM HCL 2 MG/2ML IJ SOLN
INTRAMUSCULAR | Status: DC | PRN
  Administered 2022-05-21 (×2): 1 mg via INTRAVENOUS

## 2022-05-21 MED ORDER — OXYCODONE HCL 5 MG PO TABS
ORAL_TABLET | ORAL | Status: AC
  Filled 2022-05-21: qty 1

## 2022-05-21 MED ORDER — ORAL CARE MOUTH RINSE: 15.0000 mL | Freq: Once | OROMUCOSAL | Status: AC

## 2022-05-21 MED ORDER — ACETAMINOPHEN 10 MG/ML IV SOLN
INTRAVENOUS | Status: DC | PRN
  Administered 2022-05-21: 1000 mg via INTRAVENOUS

## 2022-05-21 MED ORDER — LACTATED RINGERS IV SOLN
INTRAVENOUS | Status: DC | PRN
  Administered 2022-05-21 (×4): 3001 mL

## 2022-05-21 MED ORDER — ONDANSETRON HCL 4 MG/2ML IJ SOLN
INTRAMUSCULAR | Status: AC
  Filled 2022-05-21: qty 2

## 2022-05-21 MED ORDER — PHENYLEPHRINE HCL (PRESSORS) 10 MG/ML IV SOLN
INTRAVENOUS | Status: DC | PRN
  Administered 2022-05-21 (×2): 80 ug via INTRAVENOUS

## 2022-05-21 MED ORDER — LIDOCAINE HCL (CARDIAC) PF 100 MG/5ML IV SOSY
PREFILLED_SYRINGE | INTRAVENOUS | Status: DC | PRN
  Administered 2022-05-21: 100 mg via INTRAVENOUS

## 2022-05-21 MED ORDER — FENTANYL CITRATE (PF) 100 MCG/2ML IJ SOLN
INTRAMUSCULAR | Status: DC | PRN
  Administered 2022-05-21: 50 ug via INTRAVENOUS

## 2022-05-21 MED ORDER — SCOPOLAMINE 1 MG/3DAYS TD PT72
MEDICATED_PATCH | TRANSDERMAL | Status: AC
  Filled 2022-05-21: qty 1

## 2022-05-21 MED ORDER — HYDROCODONE-ACETAMINOPHEN 5-325 MG PO TABS: 1.0000 | ORAL_TABLET | ORAL | 0 refills | Status: AC | PRN

## 2022-05-21 MED ORDER — FENTANYL CITRATE (PF) 100 MCG/2ML IJ SOLN
25.0000 ug | INTRAMUSCULAR | Status: DC | PRN
  Administered 2022-05-21 (×2): 25 ug via INTRAVENOUS

## 2022-05-21 MED ORDER — PROPOFOL 10 MG/ML IV BOLUS
INTRAVENOUS | Status: DC | PRN
  Administered 2022-05-21: 40 mg via INTRAVENOUS
  Administered 2022-05-21: 200 mg via INTRAVENOUS
  Administered 2022-05-21: 100 mg via INTRAVENOUS

## 2022-05-21 MED ORDER — PHENYLEPHRINE 80 MCG/ML (10ML) SYRINGE FOR IV PUSH (FOR BLOOD PRESSURE SUPPORT)
PREFILLED_SYRINGE | INTRAVENOUS | Status: AC
  Filled 2022-05-21: qty 10

## 2022-05-21 MED ORDER — ONDANSETRON HCL 4 MG/2ML IJ SOLN: 4.0000 mg | Freq: Once | INTRAMUSCULAR | Status: DC | PRN

## 2022-05-21 SURGICAL SUPPLY — 58 items
ADAPTER IRRIG TUBE 2 SPIKE SOL (ADAPTER) ×2 IMPLANT
ADPR TBG 2 SPK PMP STRL ASCP (ADAPTER) ×2
APL PRP STRL LF DISP 70% ISPRP (MISCELLANEOUS) ×1
BLADE FULL RADIUS 3.5 (BLADE) IMPLANT
BLADE SHAVER 4.5X7 STR FR (MISCELLANEOUS) IMPLANT
BLADE SURG SZ11 CARB STEEL (BLADE) ×1 IMPLANT
BNDG CMPR 5X6 CHSV STRCH STRL (GAUZE/BANDAGES/DRESSINGS) ×1
BNDG CMPR 5X62 HK CLSR LF (GAUZE/BANDAGES/DRESSINGS) ×1
BNDG CMPR 6"X 5 YARDS HK CLSR (GAUZE/BANDAGES/DRESSINGS) ×1
BNDG COHESIVE 6X5 TAN ST LF (GAUZE/BANDAGES/DRESSINGS) ×1 IMPLANT
BNDG ELASTIC 6INX 5YD STR LF (GAUZE/BANDAGES/DRESSINGS) ×1 IMPLANT
BNDG ESMARCH 6 X 12 STRL LF (GAUZE/BANDAGES/DRESSINGS) ×1
BNDG ESMARCH 6X12 STRL LF (GAUZE/BANDAGES/DRESSINGS) ×1 IMPLANT
BUR BR 5.5 WIDE MOUTH (BURR) IMPLANT
CHLORAPREP W/TINT 26 (MISCELLANEOUS) ×1 IMPLANT
COOLER POLAR GLACIER W/PUMP (MISCELLANEOUS) ×1 IMPLANT
COVER LIGHT HANDLE STERIS (MISCELLANEOUS) ×2 IMPLANT
CUFF TOURN SGL QUICK 24 (TOURNIQUET CUFF)
CUFF TOURN SGL QUICK 34 (TOURNIQUET CUFF)
CUFF TRNQT CYL 24X4X16.5-23 (TOURNIQUET CUFF) IMPLANT
CUFF TRNQT CYL 34X4.125X (TOURNIQUET CUFF) IMPLANT
DEVICE SUCT BLK HOLE OR FLOOR (MISCELLANEOUS) ×1 IMPLANT
DRAPE ARTHRO LIMB 89X125 STRL (DRAPES) ×2 IMPLANT
DRAPE IMP U-DRAPE 54X76 (DRAPES) ×1 IMPLANT
ELECT REM PT RETURN 9FT ADLT (ELECTROSURGICAL)
ELECTRODE REM PT RTRN 9FT ADLT (ELECTROSURGICAL) IMPLANT
GAUZE SPONGE 4X4 12PLY STRL (GAUZE/BANDAGES/DRESSINGS) ×1 IMPLANT
GLOVE BIOGEL PI IND STRL 8 (GLOVE) ×1 IMPLANT
GLOVE ORTHO TXT STRL SZ7.5 (GLOVE) ×1 IMPLANT
GLOVE SURG ORTHO 8.0 STRL STRW (GLOVE) ×2 IMPLANT
GOWN STRL REUS W/ TWL LRG LVL3 (GOWN DISPOSABLE) ×1 IMPLANT
GOWN STRL REUS W/ TWL XL LVL3 (GOWN DISPOSABLE) ×2 IMPLANT
GOWN STRL REUS W/TWL LRG LVL3 (GOWN DISPOSABLE) ×1
GOWN STRL REUS W/TWL XL LVL3 (GOWN DISPOSABLE) ×2
IV LACTATED RINGER IRRG 3000ML (IV SOLUTION) ×4
IV LR IRRIG 3000ML ARTHROMATIC (IV SOLUTION) ×4 IMPLANT
KIT TURNOVER KIT A (KITS) ×1 IMPLANT
MANIFOLD NEPTUNE II (INSTRUMENTS) ×2 IMPLANT
MAT ABSORB  FLUID 56X50 GRAY (MISCELLANEOUS) ×2
MAT ABSORB FLUID 56X50 GRAY (MISCELLANEOUS) ×2 IMPLANT
PACK ARTHROSCOPY KNEE (MISCELLANEOUS) ×1 IMPLANT
PAD ABD DERMACEA PRESS 5X9 (GAUZE/BANDAGES/DRESSINGS) ×2 IMPLANT
PAD WRAPON POLAR KNEE (MISCELLANEOUS) ×1 IMPLANT
PADDING CAST COTTON 6X4 STRL (CAST SUPPLIES) ×1 IMPLANT
SLEEVE REMOTE CONTROL 5X12 (DRAPES) IMPLANT
SUT ETHILON 3-0 FS-10 30 BLK (SUTURE) ×1
SUT MNCRL 4-0 (SUTURE)
SUT MNCRL 4-0 27XMFL (SUTURE)
SUT VIC AB 2-0 CT2 27 (SUTURE) ×1 IMPLANT
SUTURE EHLN 3-0 FS-10 30 BLK (SUTURE) ×1 IMPLANT
SUTURE MNCRL 4-0 27XMF (SUTURE) ×1 IMPLANT
TOWEL OR 17X26 4PK STRL BLUE (TOWEL DISPOSABLE) ×2 IMPLANT
TRAP FLUID SMOKE EVACUATOR (MISCELLANEOUS) ×1 IMPLANT
TUBE SET DOUBLEFLO INFLOW (TUBING) ×1 IMPLANT
TUBE SET DOUBLEFLO OUTFLOW (TUBING) ×1 IMPLANT
WAND WEREWOLF FLOW 90D (MISCELLANEOUS) IMPLANT
WATER STERILE IRR 500ML POUR (IV SOLUTION) ×1 IMPLANT
WRAPON POLAR PAD KNEE (MISCELLANEOUS) ×1

## 2022-05-21 NOTE — Anesthesia Procedure Notes (Signed)
Procedure Name: LMA Insertion Date/Time: 05/21/2022 11:12 AM  Performed by: Malva Cogan, CRNAPre-anesthesia Checklist: Patient identified, Patient being monitored, Timeout performed, Emergency Drugs available and Suction available Patient Re-evaluated:Patient Re-evaluated prior to induction Oxygen Delivery Method: Circle system utilized Preoxygenation: Pre-oxygenation with 100% oxygen Induction Type: IV induction Ventilation: Mask ventilation without difficulty LMA: LMA inserted LMA Size: 4.0 Tube type: Oral Number of attempts: 1 Placement Confirmation: positive ETCO2 and breath sounds checked- equal and bilateral Tube secured with: Tape Dental Injury: Teeth and Oropharynx as per pre-operative assessment

## 2022-05-21 NOTE — Anesthesia Procedure Notes (Signed)
Anesthesia Regional Block: Adductor canal block   Pre-Anesthetic Checklist: , timeout performed,  Correct Patient, Correct Site, Correct Laterality,  Correct Procedure,, risks and benefits discussed,  Surgical consent,  Pre-op evaluation,  At surgeon's request and post-op pain management  Laterality: Right  Prep: chloraprep       Needles:  Injection technique: Single-shot  Needle Type: Echogenic Needle          Additional Needles:   Procedures:,,,, ultrasound used (permanent image in chart),,   Motor weakness within 20 minutes.  Narrative:  Start time: 05/21/2022 10:03 AM End time: 05/21/2022 10:05 AM Injection made incrementally with aspirations every 5 mL.  Performed by: Personally  Anesthesiologist: Reed Breech, MD  Additional Notes: Functioning IV was confirmed and monitors applied.  Sterile prep and drape, hand hygiene and sterile gloves were used. Ultrasound guidance: relevant anatomy identified, needle position confirmed, local anesthetic spread visualized around nerve(s), vascular puncture avoided.  Image saved to electronic medical record.  Negative aspiration prior to incremental administration of local anesthetic for total 10 ml Exparel and 10 ml bupivacaine 0.5% given in adductor saphenous distribution. The patient tolerated the procedure well. Vital signs and moderate sedation medications recorded in RN notes.

## 2022-05-21 NOTE — Op Note (Signed)
Operative Note    SURGERY DATE: 05/21/2022   PRE-OP DIAGNOSIS:  1. Right medial meniscus tear 2. 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 Right medial meniscus tear 2. 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 Right knee arthroscopy, partial medial meniscectomy 2. Right knee chondroplasty of medial and patellofemoral compartments   SURGEON: Rosealee Albee, MD  ASSISTANT: Sonny Dandy, PA    ANESTHESIA: Gen + regional adductor canal nerve block w/Exparel   ESTIMATED BLOOD LOSS: minimal   TOTAL IV FLUIDS: per anesthesia   INDICATION(S):  Sue Cook is a 42 y.o. female with signs and symptoms as well as MRI finding of medial meniscus tear. She has failed extensive nonoperative measures without improvement in symptoms. After discussion of risks, benefits, and alternatives to surgery, the patient elected to proceed.   OPERATIVE FINDINGS:    Examination under anesthesia: A careful examination under anesthesia was performed.  Passive range of motion was: Hyperextension: 2.  Extension: 0.  Flexion: 125.  Lachman: normal. Pivot Shift: normal.  Posterior drawer: normal.  Varus stability in full extension: normal.  Varus stability in 30 degrees of flexion: normal.  Valgus stability in full extension: normal.  Valgus stability in 30 degrees of flexion: normal.   Intra-operative findings: A thorough arthroscopic examination of the knee was performed.  The findings are: 1. Suprapatellar pouch: Normal 2. Undersurface of median ridge: Grade 2-3 degenerative changes measuring ~11x13mm 3. Medial patellar facet: Grade 1 softening 4. Lateral patellar facet: Grade 1 softening with extension of undersurface lesion 5. Trochlea: Grade 1 degenerative changes 6. Lateral gutter/popliteus tendon: Normal 7. Hoffa's fat pad: Inflamed 8. Medial gutter/plica: Normal 9. ACL: Normal 10. PCL: Normal 11. Medial meniscus: Complex  tear with primarily horizontal oblique tear pattern at posterior horn/body junction. There was also a parrot beak component of the inferior leaflet with flipped fragment in the tibial recess  12. Medial compartment cartilage: Grade 2 degenerative changes to the medial femoral condyle and Grade 1 changes to the tibial plateau 13. Lateral meniscus: Normal 14. Lateral compartment cartilage: Focal Grade 1-2 degenerative changes to the tibial plateau and normal lateral femoral condyle   OPERATIVE REPORT:     I identified Sue Cook in the pre-operative holding area. I marked the operative knee with my initials. I reviewed the risks and benefits of the proposed surgical intervention and the patient wished to proceed. The patient was transferred to the operative suite and placed in the supine position with all bony prominences padded.  Anesthesia was administered. 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 Appropriate IV antibiotics were administered prior to incision. The extremity was then prepped and draped in standard fashion. A time out was performed confirming the correct extremity, correct patient, and correct procedure.   Arthroscopy portals were marked. Local anesthetic was injected to the planned portal sites. The anterolateral portal was established with an 11 blade.      The arthroscope was placed in the anterolateral portal and then into the suprapatellar pouch. Next, the medial portal was established under needle localization. A diagnostic knee scope was completed with the above findings. The medial meniscus tear was identified.   The MCL was pie-crusted to improve visualization of the posterior horn. This tear was significantly degenerated with a flipped fragment in the tibial recess. It was not reparable. The meniscal tear was debrided  using an arthroscopic biter and an oscillating  shaver until the meniscus had stable borders. A gentle chondroplasty was performed of the medial compartment and patellofemoral compartment such that there were stable cartilage edges without any loose fragments of cartilage. Arthroscopic fluid was removed from the joint.   The portals were closed with 3-0 Nylon suture. Sterile dressings included Xeroform, 4x4s, Sof-Rol, and Bias wrap. A Polarcare was placed.  The patient was then awakened and taken to the PACU hemodynamically stable without complication.   Of note, assistance from a PA was essential to performing the surgery.  PA was present for the entire surgery.  PA assisted with patient positioning preoperatively, leg positioning intraoperatively, instrumentation, and wound closure. The surgery would have been more difficult and had longer operative time without PA assistance.    POSTOPERATIVE PLAN: The patient will be discharged home today once they meet PACU criteria. Aspirin 325 mg daily was prescribed for 2 weeks for DVT prophylaxis.  Physical therapy will start on POD#3-4. Weight-bearing as tolerated. Follow up in 2 weeks per protocol.

## 2022-05-21 NOTE — Discharge Instructions (Addendum)
Arthroscopic Knee Surgery - Partial Meniscectomy   Post-Op Instructions   1. Bracing or crutches: Crutches will be provided at the time of discharge from the surgery center if you do not already have them.   2. Ice: You may be provided with a device [facility]) that allows you to ice the affected area effectively. Otherwise you can ice manually.    3. Driving:  Plan on not driving for at least two weeks. Please note that you are advised NOT to drive while taking narcotic pain medications as you may be impaired and unsafe to drive.   4. Activity: Ankle pumps several times an hour while awake to prevent blood clots. Weight bearing: as tolerated. Use crutches for as needed (usually ~1 week or less) until pain allows you to ambulate without a limp. Bending and straightening the knee is unlimited. Elevate knee above heart level as much as possible for one week. Avoid standing more than 5 minutes (consecutively) for the first week.  Avoid long distance travel for 2 weeks.  5. Medications:  - You have been provided a prescription for narcotic pain medicine. After surgery, take 1-2 narcotic tablets every 4 hours if needed for severe pain.  - You may take up to 3000mg /day of tylenol (acetaminophen). You can take 1000mg  3x/day. Please check your narcotic. If you have acetaminophen in your narcotic (each tablet will be 325mg ), be careful not to exceed a total of 3000mg /day of acetaminophen.  - A prescription for anti-nausea medication will be provided in case the narcotic medicine or anesthesia causes nausea - take 1 tablet every 6 hours only if nauseated.  - Take ibuprofen 800 mg every 8 hours WITH food to reduce post-operative knee swelling. DO NOT STOP IBUPROFEN POST-OP UNTIL INSTRUCTED TO DO SO at first post-op office visit (10-14 days after surgery). However, please discontinue if you have any abdominal discomfort after taking this.  - Take enteric coated aspirin 325 mg once daily for 2 weeks to prevent  blood clots.    6. Bandages: The physical therapist should change the bandages at the first post-op appointment. If needed, the dressing supplies have been provided to you.   7. Physical Therapy: 1-2 times per week for 6 weeks. Therapy typically starts on post operative Day 3 or 4. You have been provided an order for physical therapy. The therapist will provide home exercises.   8. Work: May do light duty/desk job in approximately 1-2 weeks when off of narcotics, pain is well-controlled, and swelling has decreased. Labor intensive jobs may require 4-6 weeks to return.      9. Post-Op Appointments: Your first post-op appointment will be with Dr. Allena Katz in approximately 2 weeks time.    If you find that they have not been scheduled please call the Orthopaedic Appointment front desk at [phone].   AMBULATORY SURGERY  DISCHARGE INSTRUCTIONS   The drugs that you were given will stay in your system until tomorrow so for the next 24 hours you should not:  Drive an automobile Make any legal decisions Drink any alcoholic beverage   You may resume regular meals tomorrow.  Today it is better to start with liquids and gradually work up to solid foods.  You may eat anything you prefer, but it is better to start with liquids, then soup and crackers, and gradually work up to solid foods.   Please notify your doctor immediately if you have any unusual bleeding, trouble breathing, redness and pain at the surgery site, drainage,  fever, or pain not relieved by medication.    Additional Instructions:  Please contact your physician with any problems or Same Day Surgery at [phone], Monday through Friday 6 am to 4 pm, or [place] at [facility] number at [phone].   PLEASE DO NOT REMOVE GREEN TEAL BAND FOR 4 DAYS

## 2022-05-21 NOTE — Anesthesia Postprocedure Evaluation (Signed)
Anesthesia Post Note  Patient: Sue Cook  Procedure(s) Performed: Right knee arthroscopic partial medial meniscectomy (Right: Knee)  Patient location during evaluation: PACU Anesthesia Type: Regional Level of consciousness: awake and alert, oriented and patient cooperative Pain management: pain level controlled Vital Signs Assessment: post-procedure vital signs reviewed and stable Respiratory status: spontaneous breathing, nonlabored ventilation and respiratory function stable Cardiovascular status: blood pressure returned to baseline and stable Postop Assessment: adequate PO intake Anesthetic complications: no   No notable events documented.   Last Vitals:  Vitals:   05/21/22 1245 05/21/22 1304  BP:  105/66  Pulse: (!) 52 (!) 53  Resp: 15 16  Temp:  (!) 36.1 C  SpO2: 96% 100%    Last Pain:  Vitals:   05/21/22 1304  TempSrc: Temporal  PainSc: 2                  Reed Breech

## 2022-05-21 NOTE — Transfer of Care (Signed)
Immediate Anesthesia Transfer of Care Note  Patient: Sue Cook  Procedure(s) Performed: Right knee arthroscopic partial medial meniscectomy (Right: Knee)  Patient Location: PACU  Anesthesia Type:General  Level of Consciousness: awake, alert , and oriented  Airway & Oxygen Therapy: Patient Spontanous Breathing  Post-op Assessment: Report given to RN and Post -op Vital signs reviewed and stable  Post vital signs: Reviewed and stable  Last Vitals:  Vitals Value Taken Time  BP 95/59 05/21/22 1200  Temp    Pulse 61 05/21/22 1204  Resp 17 05/21/22 1204  SpO2 100 % 05/21/22 1204  Vitals shown include unvalidated device data.  Last Pain:  Vitals:   05/21/22 0922  TempSrc: Oral         Complications: No notable events documented.

## 2022-05-21 NOTE — Anesthesia Preprocedure Evaluation (Signed)
Anesthesia Evaluation  Patient identified by MRN, date of birth, ID band Patient awake    Reviewed: Allergy & Precautions, NPO status , Patient's Chart, lab work & pertinent test results  History of Anesthesia Complications (+) PONV and history of anesthetic complications  Airway Mallampati: I   Neck ROM: Full    Dental no notable dental hx.    Pulmonary neg pulmonary ROS   Pulmonary exam normal breath sounds clear to auscultation       Cardiovascular Exercise Tolerance: Good negative cardio ROS Normal cardiovascular exam Rhythm:Regular Rate:Normal     Neuro/Psych  Headaches CRPS right foot; spinal cord stimulator; vertigo    GI/Hepatic negative GI ROS,,,  Endo/Other  Class 3 obesity  Renal/GU negative Renal ROS     Musculoskeletal   Abdominal   Peds  Hematology negative hematology ROS (+)   Anesthesia Other Findings   Reproductive/Obstetrics                             Anesthesia Physical Anesthesia Plan  ASA: 3  Anesthesia Plan: General and Regional   Post-op Pain Management: Regional block*   Induction: Intravenous  PONV Risk Score and Plan: 4 or greater and Ondansetron, Dexamethasone, Treatment may vary due to age or medical condition, Propofol infusion, TIVA, Scopolamine patch - Pre-op and Midazolam  Airway Management Planned: LMA  Additional Equipment:   Intra-op Plan:   Post-operative Plan: Extubation in OR  Informed Consent: I have reviewed the patients History and Physical, chart, labs and discussed the procedure including the risks, benefits and alternatives for the proposed anesthesia with the patient or authorized representative who has indicated his/her understanding and acceptance.     Dental advisory given  Plan Discussed with: CRNA  Anesthesia Plan Comments: (Plan for preoperative adductor saphenous nerve block and GA with LMA vs. ETT. Patient  consented for risks of anesthesia including but not limited to:  - adverse reactions to medications - damage to eyes, teeth, lips or other oral mucosa - nerve damage due to positioning  - sore throat or hoarseness - damage to heart, brain, nerves, lungs, other parts of body or loss of life  Informed patient about role of CRNA in peri- and intra-operative care.  Patient voiced understanding.)       Anesthesia Quick Evaluation

## 2022-05-21 NOTE — H&P (Signed)
Paper H&P to be scanned into permanent record. H&P reviewed. No significant changes noted.  

## 2022-05-22 ENCOUNTER — Encounter: Payer: Self-pay | Admitting: Orthopedic Surgery

## 2022-05-29 ENCOUNTER — Telehealth: Payer: Self-pay | Admitting: Podiatry

## 2022-05-29 NOTE — Telephone Encounter (Signed)
Sue Cook disability spoke with her about returning to work in a sedentary position. It would involve occasionally lifting, push, pull up to 10 lbs. Mostly sitting, may involve standing or walking for brief periods. I spoke with [name] and she was ok with returning to a sedentary position. Please advise?

## 2022-07-07 ENCOUNTER — Encounter: Payer: Self-pay | Admitting: Student in an Organized Health Care Education/Training Program

## 2022-07-07 DIAGNOSIS — G90521 Complex regional pain syndrome I of right lower limb: Secondary | ICD-10-CM

## 2022-09-10 ENCOUNTER — Encounter: Payer: Self-pay | Admitting: Podiatry

## 2022-09-10 ENCOUNTER — Ambulatory Visit (INDEPENDENT_AMBULATORY_CARE_PROVIDER_SITE_OTHER): Payer: Managed Care, Other (non HMO) | Admitting: Podiatry

## 2022-09-10 DIAGNOSIS — G709 Myoneural disorder, unspecified: Secondary | ICD-10-CM | POA: Diagnosis not present

## 2022-09-10 DIAGNOSIS — M62838 Other muscle spasm: Secondary | ICD-10-CM | POA: Diagnosis not present

## 2022-09-10 DIAGNOSIS — G90521 Complex regional pain syndrome I of right lower limb: Secondary | ICD-10-CM | POA: Diagnosis not present

## 2022-09-10 NOTE — Progress Notes (Signed)
Subjective:  Patient ID: Sue Cook, female    DOB: [birth year],  MRN: [id number]  Chief Complaint  Patient presents with   Foot Pain    Pt stated that if she doesn't over do it she is okay     DOS: 12/11/2020 Procedure: Right peroneal tendon repair with tenosynovectomy  42 y.o. female presents with same complaint of foot being locked up.  She states she is managing it.  She tends to restrict her activities.  Denies any other acute complaints Review of Systems: Negative except as noted in the HPI. Denies N/V/F/Ch.  Past Medical History:  Diagnosis Date   Ankle pain 05/2016   RIGHT-PT SEEING DR Ether Griffins AND GETTING CORTISONE INJECTIONS   Complication of anesthesia    GERD (gastroesophageal reflux disease)    possibly r/t gallbladder   Headache    MIGRAINES   PONV (postoperative nausea and vomiting)     Current Outpatient Medications:    gabapentin (NEURONTIN) 100 MG capsule, Take by mouth., Disp: , Rfl:    meclizine (ANTIVERT) 25 MG tablet, Take by mouth., Disp: , Rfl:    methylPREDNISolone (MEDROL DOSEPAK) 4 MG TBPK tablet, TAKE 6 TABLETS ON DAY 1 AS DIRECTED ON PACKAGE AND DECREASE BY 1 TAB EACH DAY FOR A TOTAL OF 6 DAYS, Disp: , Rfl:    oxyCODONE-acetaminophen (PERCOCET) 7.5-325 MG tablet, See admin instructions., Disp: , Rfl:    tiZANidine (ZANAFLEX) 2 MG tablet, Take 1 tablet every 8 hours by oral route as needed for 10 days., Disp: , Rfl:    ZEPBOUND 7.5 MG/0.5ML Pen, SMARTSIG:7.5 Milligram(s) SUB-Q Once a Week, Disp: , Rfl:    acetaminophen (TYLENOL) 500 MG tablet, Take 2 tablets (1,000 mg total) by mouth every 8 (eight) hours., Disp: 90 tablet, Rfl: 2   cholecalciferol (VITAMIN D3) 25 MCG (1000 UNIT) tablet, Take 1,000 Units by mouth daily., Disp: , Rfl:    cyanocobalamin 2000 MCG tablet, Take 2,000 mcg by mouth daily., Disp: , Rfl:    HYDROcodone-acetaminophen (NORCO) 5-325 MG tablet, Take 1-2 tablets by mouth every 4 (four) hours as needed for moderate pain or severe  pain., Disp: 10 tablet, Rfl: 0   Multiple Vitamin (MULTIVITAMIN WITH MINERALS) TABS tablet, Take 1 tablet by mouth daily., Disp: , Rfl:    ondansetron (ZOFRAN) 4 MG tablet, Take 1 tablet (4 mg total) by mouth every 8 (eight) hours as needed for nausea or vomiting., Disp: 20 tablet, Rfl: 0   Semaglutide-Weight Management (WEGOVY) 0.25 MG/0.5ML SOAJ, Inject 0.25 mg into the skin once a week., Disp: , Rfl:    Semaglutide-Weight Management 0.5 MG/0.5ML SOAJ, Inject into the skin. (Patient not taking: Reported on 02/11/2022), Disp: , Rfl:    SUMAtriptan (IMITREX) 100 MG tablet, , Disp: , Rfl: 0  Social History   Tobacco Use  Smoking Status Never   Passive exposure: Never  Smokeless Tobacco Never    No Known Allergies Objective:   There were no vitals filed for this visit.  There is no height or weight on file to calculate BMI. Constitutional Well developed. Well nourished.  Vascular Foot warm and well perfused. Capillary refill normal to all digits.   Neurologic Normal speech. Oriented to person, place, and time. Epicritic sensation to light touch grossly present bilaterally.  Dermatologic No pain along the course of the surgical site/peroneal tendon.  Some weakness noted to the peroneal tendon.  Generalized pain in the leg and the lower extremity noted.  Unable to recreate the locked of the foot.  She  Orthopedic: Mild tenderness to palpation noted about the surgical site.   Radiographs: None Assessment:   No diagnosis found.          Plan:  Patient was evaluated and treated and all questions answered.  S/p foot surgery right -Clinically doing well from the surgical site standpoint however her foot is still hurting and it is very frustrating for her not to be able to figure out.  Right foot locking up/neuromuscular issues/CRPS -I explained the patient the etiology of neuromuscular disease/CRPS and worse treatment options were discussed. -Continue physical therapy as  needed -She is being seen at the pain management as needed -At this time patient we will continue long-term disability indefinitely.  At this time patient will follow-up with me every 6 months to a year to evaluate.  No follow-ups on file.

## 2023-03-13 ENCOUNTER — Ambulatory Visit: Payer: Managed Care, Other (non HMO) | Admitting: Podiatry

## 2023-03-18 ENCOUNTER — Ambulatory Visit: Payer: Managed Care, Other (non HMO) | Admitting: Podiatry

## 2023-03-21 ENCOUNTER — Other Ambulatory Visit: Payer: Self-pay | Admitting: Family Medicine

## 2023-03-21 DIAGNOSIS — Z1231 Encounter for screening mammogram for malignant neoplasm of breast: Secondary | ICD-10-CM

## 2023-03-27 ENCOUNTER — Telehealth: Payer: Self-pay | Admitting: Podiatry

## 2023-03-27 ENCOUNTER — Ambulatory Visit: Payer: Managed Care, Other (non HMO) | Admitting: Podiatry

## 2023-03-27 DIAGNOSIS — G90521 Complex regional pain syndrome I of right lower limb: Secondary | ICD-10-CM | POA: Diagnosis not present

## 2023-03-27 NOTE — Progress Notes (Signed)
 Subjective:  Patient ID: Sue Cook, female    DOB: December 11, 1980,  MRN: [id number]  Chief Complaint  Patient presents with   Complex regional pain syndrome type 1 of right lower extrem    DOS: 12/11/2020 Procedure: Right peroneal tendon repair with tenosynovectomy  43 y.o. female presents with same complaint of foot being locked up.  She states she is managing it.  She tends to restrict her activities.  Denies any other acute complaints Review of Systems: Negative except as noted in the HPI. Denies N/V/F/Ch.  Past Medical History:  Diagnosis Date   Ankle pain 05/2016   RIGHT-PT SEEING DR Ether Griffins AND GETTING CORTISONE INJECTIONS   Complication of anesthesia    GERD (gastroesophageal reflux disease)    possibly r/t gallbladder   Headache    MIGRAINES   PONV (postoperative nausea and vomiting)     Current Outpatient Medications:    acetaminophen (TYLENOL) 500 MG tablet, Take 2 tablets (1,000 mg total) by mouth every 8 (eight) hours., Disp: 90 tablet, Rfl: 2   cholecalciferol (VITAMIN D3) 25 MCG (1000 UNIT) tablet, Take 1,000 Units by mouth daily., Disp: , Rfl:    cyanocobalamin 2000 MCG tablet, Take 2,000 mcg by mouth daily., Disp: , Rfl:    gabapentin (NEURONTIN) 100 MG capsule, Take by mouth., Disp: , Rfl:    HYDROcodone-acetaminophen (NORCO) 5-325 MG tablet, Take 1-2 tablets by mouth every 4 (four) hours as needed for moderate pain or severe pain., Disp: 10 tablet, Rfl: 0   meclizine (ANTIVERT) 25 MG tablet, Take by mouth., Disp: , Rfl:    methylPREDNISolone (MEDROL DOSEPAK) 4 MG TBPK tablet, TAKE 6 TABLETS ON DAY 1 AS DIRECTED ON PACKAGE AND DECREASE BY 1 TAB EACH DAY FOR A TOTAL OF 6 DAYS, Disp: , Rfl:    Multiple Vitamin (MULTIVITAMIN WITH MINERALS) TABS tablet, Take 1 tablet by mouth daily., Disp: , Rfl:    ondansetron (ZOFRAN) 4 MG tablet, Take 1 tablet (4 mg total) by mouth every 8 (eight) hours as needed for nausea or vomiting., Disp: 20 tablet, Rfl: 0    oxyCODONE-acetaminophen (PERCOCET) 7.5-325 MG tablet, See admin instructions., Disp: , Rfl:    Semaglutide-Weight Management (WEGOVY) 0.25 MG/0.5ML SOAJ, Inject 0.25 mg into the skin once a week., Disp: , Rfl:    Semaglutide-Weight Management 0.5 MG/0.5ML SOAJ, Inject into the skin. (Patient not taking: Reported on 02/11/2022), Disp: , Rfl:    SUMAtriptan (IMITREX) 100 MG tablet, , Disp: , Rfl: 0   tiZANidine (ZANAFLEX) 2 MG tablet, Take 1 tablet every 8 hours by oral route as needed for 10 days., Disp: , Rfl:    ZEPBOUND 7.5 MG/0.5ML Pen, SMARTSIG:7.5 Milligram(s) SUB-Q Once a Week, Disp: , Rfl:   Social History   Tobacco Use  Smoking Status Never   Passive exposure: Never  Smokeless Tobacco Never    No Known Allergies Objective:   There were no vitals filed for this visit.  There is no height or weight on file to calculate BMI. Constitutional Well developed. Well nourished.  Vascular Foot warm and well perfused. Capillary refill normal to all digits.   Neurologic Normal speech. Oriented to person, place, and time. Epicritic sensation to light touch grossly present bilaterally.  Dermatologic No pain along the course of the surgical site/peroneal tendon.  Some weakness noted to the peroneal tendon.  Generalized pain in the leg and the lower extremity noted.  Unable to recreate the locked of the foot.  She  Orthopedic: Mild tenderness to palpation  noted about the surgical site.   Radiographs: None Assessment:   1. Complex regional pain syndrome type 1 of right lower extremity             Plan:  Patient was evaluated and treated and all questions answered.  S/p foot surgery right -Clinically doing well from the surgical site standpoint however her foot is still hurting and it is very frustrating for her not to be able to figure out.  Right foot locking up/neuromuscular issues/CRPS -I explained the patient the etiology of neuromuscular disease/CRPS and worse treatment  options were discussed. -Continue physical therapy and bone stimulator as needed. -She is being seen at the pain management as needed -At this time patient we will continue long-term disability indefinitely.  At this time patient will follow-up with me every 6 months to a year to evaluate.  No follow-ups on file.

## 2023-03-27 NOTE — Telephone Encounter (Signed)
 Faxed L[employer] forms to [employer] [phone] with notes. Leave to continue indefinitely per notes.

## 2023-03-31 ENCOUNTER — Ambulatory Visit
Admission: RE | Admit: 2023-03-31 | Discharge: 2023-03-31 | Disposition: A | Discharge status: Home / Self Care | Payer: Managed Care, Other (non HMO) | Source: Ambulatory Visit | Attending: Family Medicine | Admitting: Family Medicine

## 2023-03-31 DIAGNOSIS — Z1231 Encounter for screening mammogram for malignant neoplasm of breast: Secondary | ICD-10-CM | POA: Diagnosis present

## 2023-11-27 ENCOUNTER — Ambulatory Visit: Admitting: Podiatry
